# Patient Record
Sex: Female | Born: 1945 | Race: Black or African American | Hispanic: No | Smoking: Former smoker
Health system: Southern US, Community
[De-identification: ages and names within clinical notes are randomized; demographics above are authoritative.]

## PROBLEM LIST (undated history)

## (undated) DIAGNOSIS — R002 Palpitations: Secondary | ICD-10-CM

## (undated) DIAGNOSIS — K219 Gastro-esophageal reflux disease without esophagitis: Secondary | ICD-10-CM

## (undated) DIAGNOSIS — I471 Supraventricular tachycardia, unspecified: Secondary | ICD-10-CM

## (undated) DIAGNOSIS — E7800 Pure hypercholesterolemia, unspecified: Secondary | ICD-10-CM

## (undated) DIAGNOSIS — E669 Obesity, unspecified: Secondary | ICD-10-CM

## (undated) DIAGNOSIS — M549 Dorsalgia, unspecified: Secondary | ICD-10-CM

## (undated) DIAGNOSIS — M1990 Unspecified osteoarthritis, unspecified site: Secondary | ICD-10-CM

## (undated) DIAGNOSIS — I10 Essential (primary) hypertension: Secondary | ICD-10-CM

## (undated) DIAGNOSIS — E119 Type 2 diabetes mellitus without complications: Secondary | ICD-10-CM

## (undated) HISTORY — PX: TUBAL LIGATION: SHX77

## (undated) HISTORY — PX: CHOLECYSTECTOMY: SHX55

## (undated) HISTORY — DX: Unspecified osteoarthritis, unspecified site: M19.90

## (undated) HISTORY — PX: RETINAL DETACHMENT REPAIR W/ SCLERAL BUCKLE LE: SHX2338

## (undated) HISTORY — DX: Supraventricular tachycardia, unspecified: I47.10

## (undated) HISTORY — PX: APPENDECTOMY: SHX54

## (undated) HISTORY — DX: Obesity, unspecified: E66.9

## (undated) HISTORY — DX: Essential (primary) hypertension: I10

## (undated) HISTORY — DX: Type 2 diabetes mellitus without complications: E11.9

## (undated) HISTORY — PX: ABDOMINAL HYSTERECTOMY: SHX81

## (undated) HISTORY — DX: Supraventricular tachycardia: I47.1

## (undated) HISTORY — PX: VESICOVAGINAL FISTULA CLOSURE W/ TAH: SUR271

## (undated) HISTORY — DX: Dorsalgia, unspecified: M54.9

## (undated) HISTORY — DX: Gastro-esophageal reflux disease without esophagitis: K21.9

## (undated) HISTORY — PX: LEFT OOPHORECTOMY: SHX1961

---

## 2001-01-15 ENCOUNTER — Other Ambulatory Visit: Admission: RE | Admit: 2001-01-15 | Discharge: 2001-01-15 | Payer: Self-pay | Admitting: Family Medicine

## 2001-01-16 ENCOUNTER — Other Ambulatory Visit: Admission: RE | Admit: 2001-01-16 | Discharge: 2001-01-16 | Payer: Self-pay | Admitting: Family Medicine

## 2001-01-17 ENCOUNTER — Ambulatory Visit (HOSPITAL_COMMUNITY): Admission: RE | Admit: 2001-01-17 | Discharge: 2001-01-17 | Payer: Self-pay | Admitting: Family Medicine

## 2001-01-17 ENCOUNTER — Encounter: Payer: Self-pay | Admitting: Family Medicine

## 2001-12-15 ENCOUNTER — Emergency Department (HOSPITAL_COMMUNITY): Admission: EM | Admit: 2001-12-15 | Discharge: 2001-12-15 | Payer: Self-pay | Admitting: *Deleted

## 2002-02-14 ENCOUNTER — Ambulatory Visit (HOSPITAL_COMMUNITY): Admission: RE | Admit: 2002-02-14 | Discharge: 2002-02-14 | Payer: Self-pay | Admitting: Family Medicine

## 2002-04-26 ENCOUNTER — Ambulatory Visit (HOSPITAL_COMMUNITY): Admission: RE | Admit: 2002-04-26 | Discharge: 2002-04-26 | Payer: Self-pay | Admitting: Internal Medicine

## 2002-05-15 ENCOUNTER — Encounter: Admission: RE | Admit: 2002-05-15 | Discharge: 2002-08-13 | Payer: Self-pay | Admitting: Family Medicine

## 2002-09-11 ENCOUNTER — Ambulatory Visit (HOSPITAL_COMMUNITY): Admission: RE | Admit: 2002-09-11 | Discharge: 2002-09-11 | Payer: Self-pay | Admitting: Family Medicine

## 2002-09-11 ENCOUNTER — Encounter: Payer: Self-pay | Admitting: Family Medicine

## 2003-07-07 ENCOUNTER — Ambulatory Visit (HOSPITAL_COMMUNITY): Admission: RE | Admit: 2003-07-07 | Discharge: 2003-07-07 | Payer: Self-pay | Admitting: Family Medicine

## 2003-09-19 ENCOUNTER — Ambulatory Visit (HOSPITAL_COMMUNITY): Admission: RE | Admit: 2003-09-19 | Discharge: 2003-09-19 | Payer: Self-pay | Admitting: Family Medicine

## 2003-12-17 ENCOUNTER — Emergency Department (HOSPITAL_COMMUNITY): Admission: EM | Admit: 2003-12-17 | Discharge: 2003-12-17 | Payer: Self-pay | Admitting: Emergency Medicine

## 2004-01-22 ENCOUNTER — Ambulatory Visit (HOSPITAL_COMMUNITY): Admission: RE | Admit: 2004-01-22 | Discharge: 2004-01-22 | Payer: Self-pay | Admitting: Family Medicine

## 2004-04-05 ENCOUNTER — Ambulatory Visit: Payer: Self-pay | Admitting: Family Medicine

## 2004-05-06 ENCOUNTER — Ambulatory Visit: Payer: Self-pay | Admitting: Family Medicine

## 2004-05-08 ENCOUNTER — Emergency Department (HOSPITAL_COMMUNITY): Admission: EM | Admit: 2004-05-08 | Discharge: 2004-05-08 | Payer: Self-pay | Admitting: Emergency Medicine

## 2004-05-26 ENCOUNTER — Ambulatory Visit: Payer: Self-pay | Admitting: Family Medicine

## 2004-10-22 ENCOUNTER — Ambulatory Visit: Payer: Self-pay | Admitting: Family Medicine

## 2004-11-08 ENCOUNTER — Ambulatory Visit: Payer: Self-pay | Admitting: Family Medicine

## 2004-12-08 ENCOUNTER — Ambulatory Visit: Payer: Self-pay | Admitting: Family Medicine

## 2005-01-10 ENCOUNTER — Ambulatory Visit: Payer: Self-pay | Admitting: Family Medicine

## 2005-01-25 ENCOUNTER — Ambulatory Visit: Payer: Self-pay | Admitting: Family Medicine

## 2005-03-09 ENCOUNTER — Ambulatory Visit: Payer: Self-pay | Admitting: Family Medicine

## 2005-03-15 ENCOUNTER — Ambulatory Visit (HOSPITAL_COMMUNITY): Admission: RE | Admit: 2005-03-15 | Discharge: 2005-03-15 | Payer: Self-pay | Admitting: Family Medicine

## 2005-03-30 ENCOUNTER — Ambulatory Visit: Payer: Self-pay | Admitting: Family Medicine

## 2005-04-11 ENCOUNTER — Emergency Department (HOSPITAL_COMMUNITY): Admission: EM | Admit: 2005-04-11 | Discharge: 2005-04-11 | Payer: Self-pay | Admitting: Emergency Medicine

## 2005-04-12 ENCOUNTER — Ambulatory Visit: Payer: Self-pay | Admitting: Family Medicine

## 2005-05-12 ENCOUNTER — Ambulatory Visit: Payer: Self-pay | Admitting: Family Medicine

## 2005-05-26 HISTORY — PX: ENUCLEATION: SHX628

## 2005-06-23 HISTORY — PX: OTHER SURGICAL HISTORY: SHX169

## 2005-12-28 ENCOUNTER — Ambulatory Visit: Payer: Self-pay | Admitting: Family Medicine

## 2006-01-02 ENCOUNTER — Ambulatory Visit (HOSPITAL_COMMUNITY): Payer: Self-pay | Admitting: Psychiatry

## 2006-01-10 ENCOUNTER — Ambulatory Visit (HOSPITAL_COMMUNITY): Payer: Self-pay | Admitting: Psychiatry

## 2006-01-26 ENCOUNTER — Ambulatory Visit (HOSPITAL_COMMUNITY): Payer: Self-pay | Admitting: Psychiatry

## 2006-01-31 ENCOUNTER — Ambulatory Visit (HOSPITAL_COMMUNITY): Payer: Self-pay | Admitting: Psychiatry

## 2006-02-14 ENCOUNTER — Ambulatory Visit: Payer: Self-pay | Admitting: Family Medicine

## 2006-02-28 ENCOUNTER — Ambulatory Visit (HOSPITAL_COMMUNITY): Payer: Self-pay | Admitting: Psychiatry

## 2006-03-08 ENCOUNTER — Ambulatory Visit (HOSPITAL_COMMUNITY): Payer: Self-pay | Admitting: Psychiatry

## 2006-04-05 ENCOUNTER — Ambulatory Visit: Payer: Self-pay | Admitting: Family Medicine

## 2006-04-06 ENCOUNTER — Ambulatory Visit (HOSPITAL_COMMUNITY): Payer: Self-pay | Admitting: Psychiatry

## 2006-04-06 ENCOUNTER — Ambulatory Visit (HOSPITAL_COMMUNITY): Admission: RE | Admit: 2006-04-06 | Discharge: 2006-04-06 | Payer: Self-pay | Admitting: Family Medicine

## 2006-06-01 ENCOUNTER — Ambulatory Visit: Payer: Self-pay | Admitting: Family Medicine

## 2006-07-24 ENCOUNTER — Ambulatory Visit: Payer: Self-pay | Admitting: Family Medicine

## 2006-07-25 ENCOUNTER — Encounter: Payer: Self-pay | Admitting: Family Medicine

## 2006-10-24 ENCOUNTER — Ambulatory Visit (HOSPITAL_COMMUNITY): Payer: Self-pay | Admitting: Psychiatry

## 2006-10-25 ENCOUNTER — Ambulatory Visit: Payer: Self-pay | Admitting: Family Medicine

## 2006-10-25 LAB — CONVERTED CEMR LAB
ALT: 35 units/L (ref 0–35)
AST: 27 units/L (ref 0–37)
Alkaline Phosphatase: 114 units/L (ref 39–117)
Bilirubin, Direct: 0.1 mg/dL (ref 0.0–0.3)
CO2: 24 meq/L (ref 19–32)
Calcium: 9.8 mg/dL (ref 8.4–10.5)
Cholesterol: 199 mg/dL (ref 0–200)
Creatinine, Ser: 1.36 mg/dL — ABNORMAL HIGH (ref 0.40–1.20)
Glucose, Bld: 193 mg/dL — ABNORMAL HIGH (ref 70–99)
Hgb A1c MFr Bld: 8.7 % — ABNORMAL HIGH (ref 4.6–6.1)
Indirect Bilirubin: 0.3 mg/dL (ref 0.0–0.9)
Sodium: 140 meq/L (ref 135–145)
Total Bilirubin: 0.4 mg/dL (ref 0.3–1.2)
Total CHOL/HDL Ratio: 5

## 2006-10-26 ENCOUNTER — Encounter: Payer: Self-pay | Admitting: Family Medicine

## 2006-11-07 ENCOUNTER — Ambulatory Visit (HOSPITAL_COMMUNITY): Payer: Self-pay | Admitting: Psychiatry

## 2006-12-21 ENCOUNTER — Ambulatory Visit (HOSPITAL_COMMUNITY): Payer: Self-pay | Admitting: Psychiatry

## 2007-04-18 ENCOUNTER — Ambulatory Visit: Payer: Self-pay | Admitting: Family Medicine

## 2007-04-25 ENCOUNTER — Emergency Department (HOSPITAL_COMMUNITY): Admission: EM | Admit: 2007-04-25 | Discharge: 2007-04-25 | Payer: Self-pay | Admitting: Emergency Medicine

## 2007-05-01 ENCOUNTER — Ambulatory Visit: Payer: Self-pay | Admitting: Family Medicine

## 2007-05-16 ENCOUNTER — Ambulatory Visit: Payer: Self-pay | Admitting: Family Medicine

## 2007-06-13 ENCOUNTER — Ambulatory Visit: Payer: Self-pay | Admitting: Family Medicine

## 2007-06-24 HISTORY — PX: OTHER SURGICAL HISTORY: SHX169

## 2007-11-30 ENCOUNTER — Encounter: Payer: Self-pay | Admitting: Family Medicine

## 2007-11-30 DIAGNOSIS — IMO0001 Reserved for inherently not codable concepts without codable children: Secondary | ICD-10-CM | POA: Insufficient documentation

## 2007-11-30 DIAGNOSIS — R519 Headache, unspecified: Secondary | ICD-10-CM | POA: Insufficient documentation

## 2007-11-30 DIAGNOSIS — M549 Dorsalgia, unspecified: Secondary | ICD-10-CM | POA: Insufficient documentation

## 2007-11-30 DIAGNOSIS — R51 Headache: Secondary | ICD-10-CM | POA: Insufficient documentation

## 2007-11-30 DIAGNOSIS — E119 Type 2 diabetes mellitus without complications: Secondary | ICD-10-CM | POA: Insufficient documentation

## 2007-11-30 DIAGNOSIS — I10 Essential (primary) hypertension: Secondary | ICD-10-CM | POA: Insufficient documentation

## 2007-11-30 DIAGNOSIS — M1990 Unspecified osteoarthritis, unspecified site: Secondary | ICD-10-CM | POA: Insufficient documentation

## 2007-12-05 ENCOUNTER — Encounter: Payer: Self-pay | Admitting: Family Medicine

## 2007-12-13 ENCOUNTER — Encounter: Payer: Self-pay | Admitting: Family Medicine

## 2008-01-29 ENCOUNTER — Ambulatory Visit: Payer: Self-pay | Admitting: Family Medicine

## 2008-01-29 DIAGNOSIS — M810 Age-related osteoporosis without current pathological fracture: Secondary | ICD-10-CM | POA: Insufficient documentation

## 2008-01-29 DIAGNOSIS — M79609 Pain in unspecified limb: Secondary | ICD-10-CM | POA: Insufficient documentation

## 2008-01-29 LAB — CONVERTED CEMR LAB: Glucose, Bld: 186 mg/dL

## 2008-02-03 DIAGNOSIS — K219 Gastro-esophageal reflux disease without esophagitis: Secondary | ICD-10-CM | POA: Insufficient documentation

## 2008-02-14 ENCOUNTER — Encounter: Payer: Self-pay | Admitting: Family Medicine

## 2008-02-21 ENCOUNTER — Encounter: Payer: Self-pay | Admitting: Family Medicine

## 2008-03-05 ENCOUNTER — Encounter: Payer: Self-pay | Admitting: Family Medicine

## 2008-03-19 ENCOUNTER — Encounter: Payer: Self-pay | Admitting: Family Medicine

## 2008-04-21 ENCOUNTER — Encounter: Payer: Self-pay | Admitting: Family Medicine

## 2008-04-22 ENCOUNTER — Encounter: Payer: Self-pay | Admitting: Family Medicine

## 2008-04-30 ENCOUNTER — Ambulatory Visit: Payer: Self-pay | Admitting: Family Medicine

## 2008-04-30 ENCOUNTER — Encounter (INDEPENDENT_AMBULATORY_CARE_PROVIDER_SITE_OTHER): Payer: Self-pay | Admitting: *Deleted

## 2008-04-30 DIAGNOSIS — R5381 Other malaise: Secondary | ICD-10-CM | POA: Insufficient documentation

## 2008-04-30 DIAGNOSIS — R5383 Other fatigue: Secondary | ICD-10-CM | POA: Insufficient documentation

## 2008-04-30 LAB — CONVERTED CEMR LAB
ALT: 26 units/L
Albumin: 4.4 g/dL
CO2: 21 meq/L
Calcium: 9.5 mg/dL
Chloride: 103 meq/L
Creatinine, Ser: 1.19 mg/dL
LDL Cholesterol: 93 mg/dL
Potassium: 4 meq/L

## 2008-05-01 LAB — CONVERTED CEMR LAB
BUN: 18 mg/dL (ref 6–23)
Bilirubin, Direct: 0.1 mg/dL (ref 0.0–0.3)
Chloride: 103 meq/L (ref 96–112)
Creatinine, Urine: 155 mg/dL
Glucose, Bld: 172 mg/dL — ABNORMAL HIGH (ref 70–99)
Indirect Bilirubin: 0.3 mg/dL (ref 0.0–0.9)
LDL Cholesterol: 93 mg/dL (ref 0–99)
Lymphs Abs: 3.1 10*3/uL (ref 0.7–4.0)
Microalb, Ur: 1.75 mg/dL (ref 0.00–1.89)
Monocytes Relative: 7 % (ref 3–12)
Neutro Abs: 4.7 10*3/uL (ref 1.7–7.7)
Neutrophils Relative %: 54 % (ref 43–77)
Potassium: 4 meq/L (ref 3.5–5.3)
RBC: 4.39 M/uL (ref 3.87–5.11)
Total Protein: 7.2 g/dL (ref 6.0–8.3)
VLDL: 51 mg/dL — ABNORMAL HIGH (ref 0–40)
WBC: 8.7 10*3/uL (ref 4.0–10.5)

## 2008-05-14 ENCOUNTER — Ambulatory Visit (HOSPITAL_COMMUNITY): Admission: RE | Admit: 2008-05-14 | Discharge: 2008-05-14 | Payer: Self-pay | Admitting: Family Medicine

## 2008-05-19 ENCOUNTER — Telehealth: Payer: Self-pay | Admitting: Family Medicine

## 2008-05-29 ENCOUNTER — Telehealth: Payer: Self-pay | Admitting: Family Medicine

## 2008-07-11 ENCOUNTER — Encounter: Payer: Self-pay | Admitting: Family Medicine

## 2008-09-15 ENCOUNTER — Telehealth (INDEPENDENT_AMBULATORY_CARE_PROVIDER_SITE_OTHER): Payer: Self-pay | Admitting: *Deleted

## 2008-09-15 ENCOUNTER — Ambulatory Visit: Payer: Self-pay | Admitting: Family Medicine

## 2008-09-15 DIAGNOSIS — J209 Acute bronchitis, unspecified: Secondary | ICD-10-CM | POA: Insufficient documentation

## 2008-09-15 DIAGNOSIS — J0100 Acute maxillary sinusitis, unspecified: Secondary | ICD-10-CM | POA: Insufficient documentation

## 2008-09-15 DIAGNOSIS — N63 Unspecified lump in unspecified breast: Secondary | ICD-10-CM | POA: Insufficient documentation

## 2008-09-15 LAB — CONVERTED CEMR LAB: Glucose, Bld: 76 mg/dL

## 2008-11-28 ENCOUNTER — Encounter: Payer: Self-pay | Admitting: Family Medicine

## 2008-12-03 ENCOUNTER — Encounter: Payer: Self-pay | Admitting: Family Medicine

## 2008-12-17 ENCOUNTER — Ambulatory Visit: Payer: Self-pay | Admitting: Family Medicine

## 2008-12-17 LAB — CONVERTED CEMR LAB: Hgb A1c MFr Bld: 7.4 %

## 2008-12-30 ENCOUNTER — Encounter: Payer: Self-pay | Admitting: Family Medicine

## 2009-02-09 ENCOUNTER — Encounter: Payer: Self-pay | Admitting: Family Medicine

## 2009-02-11 ENCOUNTER — Encounter: Payer: Self-pay | Admitting: Family Medicine

## 2009-02-20 ENCOUNTER — Encounter: Payer: Self-pay | Admitting: Family Medicine

## 2009-03-05 ENCOUNTER — Telehealth: Payer: Self-pay | Admitting: Family Medicine

## 2009-03-06 ENCOUNTER — Ambulatory Visit: Payer: Self-pay | Admitting: Family Medicine

## 2009-03-15 ENCOUNTER — Emergency Department (HOSPITAL_COMMUNITY): Admission: EM | Admit: 2009-03-15 | Discharge: 2009-03-15 | Payer: Self-pay | Admitting: Emergency Medicine

## 2009-03-16 ENCOUNTER — Encounter: Payer: Self-pay | Admitting: Family Medicine

## 2009-03-17 ENCOUNTER — Ambulatory Visit: Payer: Self-pay | Admitting: Family Medicine

## 2009-03-17 DIAGNOSIS — M25559 Pain in unspecified hip: Secondary | ICD-10-CM | POA: Insufficient documentation

## 2009-03-17 DIAGNOSIS — I471 Supraventricular tachycardia: Secondary | ICD-10-CM | POA: Insufficient documentation

## 2009-03-17 LAB — CONVERTED CEMR LAB
Glucose, Bld: 206 mg/dL
Hgb A1c MFr Bld: 7.6 %

## 2009-04-01 ENCOUNTER — Encounter (INDEPENDENT_AMBULATORY_CARE_PROVIDER_SITE_OTHER): Payer: Self-pay | Admitting: *Deleted

## 2009-04-02 ENCOUNTER — Ambulatory Visit: Payer: Self-pay | Admitting: Cardiology

## 2009-04-13 ENCOUNTER — Telehealth: Payer: Self-pay | Admitting: Family Medicine

## 2009-04-21 ENCOUNTER — Encounter: Payer: Self-pay | Admitting: Family Medicine

## 2009-04-22 ENCOUNTER — Encounter: Payer: Self-pay | Admitting: Family Medicine

## 2009-06-26 ENCOUNTER — Encounter: Payer: Self-pay | Admitting: Family Medicine

## 2009-07-27 ENCOUNTER — Ambulatory Visit: Payer: Self-pay | Admitting: Family Medicine

## 2009-07-27 DIAGNOSIS — M5430 Sciatica, unspecified side: Secondary | ICD-10-CM | POA: Insufficient documentation

## 2009-07-27 DIAGNOSIS — M549 Dorsalgia, unspecified: Secondary | ICD-10-CM

## 2009-07-28 ENCOUNTER — Encounter: Payer: Self-pay | Admitting: Family Medicine

## 2009-08-02 ENCOUNTER — Telehealth: Payer: Self-pay | Admitting: Family Medicine

## 2009-08-04 ENCOUNTER — Encounter: Payer: Self-pay | Admitting: Family Medicine

## 2009-08-07 ENCOUNTER — Encounter: Payer: Self-pay | Admitting: Family Medicine

## 2009-08-10 ENCOUNTER — Encounter (HOSPITAL_COMMUNITY): Admission: RE | Admit: 2009-08-10 | Discharge: 2009-09-09 | Payer: Self-pay | Admitting: Family Medicine

## 2009-08-11 LAB — CONVERTED CEMR LAB
AST: 44 units/L — ABNORMAL HIGH (ref 0–37)
Albumin: 4.6 g/dL (ref 3.5–5.2)
Alkaline Phosphatase: 85 units/L (ref 39–117)
BUN: 17 mg/dL (ref 6–23)
Basophils Absolute: 0.1 10*3/uL (ref 0.0–0.1)
Basophils Relative: 1 % (ref 0–1)
Calcium: 9 mg/dL (ref 8.4–10.5)
Creatinine, Ser: 1.41 mg/dL — ABNORMAL HIGH (ref 0.40–1.20)
Eosinophils Absolute: 0.1 10*3/uL (ref 0.0–0.7)
Eosinophils Relative: 2 % (ref 0–5)
HDL: 36 mg/dL — ABNORMAL LOW (ref >39)
MCHC: 32 g/dL (ref 30.0–36.0)
MCV: 89.3 fL (ref 78.0–100.0)
Microalb, Ur: 2.11 mg/dL — ABNORMAL HIGH (ref 0.00–1.89)
Neutrophils Relative %: 44 % (ref 43–77)
Platelets: 283 10*3/uL (ref 150–400)
RDW: 13.5 % (ref 11.5–15.5)
Total Protein: 6.7 g/dL (ref 6.0–8.3)
Triglycerides: 255 mg/dL — ABNORMAL HIGH (ref <150)
WBC: 6.9 10*3/uL (ref 4.0–10.5)

## 2009-08-20 ENCOUNTER — Encounter: Payer: Self-pay | Admitting: Family Medicine

## 2009-08-21 ENCOUNTER — Encounter: Payer: Self-pay | Admitting: Family Medicine

## 2009-09-15 ENCOUNTER — Ambulatory Visit: Payer: Self-pay | Admitting: Family Medicine

## 2009-09-16 LAB — CONVERTED CEMR LAB
CO2: 24 meq/L (ref 19–32)
Calcium: 9.6 mg/dL (ref 8.4–10.5)
Chloride: 107 meq/L (ref 96–112)
Creatinine, Ser: 1.21 mg/dL — ABNORMAL HIGH (ref 0.40–1.20)
Glucose, Bld: 151 mg/dL — ABNORMAL HIGH (ref 70–99)
Sodium: 138 meq/L (ref 135–145)

## 2009-09-22 ENCOUNTER — Telehealth: Payer: Self-pay | Admitting: Family Medicine

## 2009-09-23 ENCOUNTER — Encounter: Payer: Self-pay | Admitting: Family Medicine

## 2009-10-21 ENCOUNTER — Telehealth: Payer: Self-pay | Admitting: Family Medicine

## 2009-10-27 ENCOUNTER — Ambulatory Visit: Payer: Self-pay | Admitting: Family Medicine

## 2009-10-28 ENCOUNTER — Encounter: Payer: Self-pay | Admitting: Family Medicine

## 2009-10-29 ENCOUNTER — Encounter: Payer: Self-pay | Admitting: Family Medicine

## 2010-02-02 ENCOUNTER — Ambulatory Visit: Payer: Self-pay | Admitting: Family Medicine

## 2010-02-10 ENCOUNTER — Ambulatory Visit (HOSPITAL_COMMUNITY): Admission: RE | Admit: 2010-02-10 | Discharge: 2010-02-10 | Payer: Self-pay | Admitting: Family Medicine

## 2010-02-13 ENCOUNTER — Emergency Department (HOSPITAL_COMMUNITY): Admission: EM | Admit: 2010-02-13 | Discharge: 2010-02-14 | Payer: Self-pay | Admitting: Emergency Medicine

## 2010-02-21 ENCOUNTER — Telehealth: Payer: Self-pay | Admitting: Family Medicine

## 2010-03-08 ENCOUNTER — Telehealth: Payer: Self-pay | Admitting: Family Medicine

## 2010-03-08 ENCOUNTER — Ambulatory Visit (HOSPITAL_COMMUNITY): Admission: RE | Admit: 2010-03-08 | Discharge: 2010-03-08 | Payer: Self-pay | Admitting: Family Medicine

## 2010-03-08 ENCOUNTER — Encounter: Payer: Self-pay | Admitting: Family Medicine

## 2010-03-08 DIAGNOSIS — M25579 Pain in unspecified ankle and joints of unspecified foot: Secondary | ICD-10-CM | POA: Insufficient documentation

## 2010-03-08 DIAGNOSIS — M7660 Achilles tendinitis, unspecified leg: Secondary | ICD-10-CM | POA: Insufficient documentation

## 2010-04-01 ENCOUNTER — Encounter: Payer: Self-pay | Admitting: Family Medicine

## 2010-04-01 ENCOUNTER — Ambulatory Visit: Payer: Self-pay | Admitting: Family Medicine

## 2010-04-01 DIAGNOSIS — F329 Major depressive disorder, single episode, unspecified: Secondary | ICD-10-CM | POA: Insufficient documentation

## 2010-04-01 DIAGNOSIS — F3289 Other specified depressive episodes: Secondary | ICD-10-CM | POA: Insufficient documentation

## 2010-04-02 LAB — CONVERTED CEMR LAB
Calcium: 9.5 mg/dL (ref 8.4–10.5)
Chloride: 104 meq/L (ref 96–112)
Creatinine, Ser: 1.08 mg/dL (ref 0.40–1.20)
Sodium: 141 meq/L (ref 135–145)

## 2010-04-05 ENCOUNTER — Encounter: Payer: Self-pay | Admitting: Family Medicine

## 2010-04-13 ENCOUNTER — Ambulatory Visit (HOSPITAL_COMMUNITY): Payer: Self-pay | Admitting: Psychiatry

## 2010-05-11 ENCOUNTER — Telehealth: Payer: Self-pay | Admitting: Family Medicine

## 2010-05-16 ENCOUNTER — Encounter: Payer: Self-pay | Admitting: Family Medicine

## 2010-05-17 ENCOUNTER — Ambulatory Visit (HOSPITAL_COMMUNITY): Admit: 2010-05-17 | Payer: Self-pay | Admitting: Psychiatry

## 2010-05-17 ENCOUNTER — Encounter: Payer: Self-pay | Admitting: Family Medicine

## 2010-05-25 ENCOUNTER — Telehealth: Payer: Self-pay | Admitting: Family Medicine

## 2010-05-27 ENCOUNTER — Encounter: Payer: Self-pay | Admitting: Family Medicine

## 2010-05-27 NOTE — Assessment & Plan Note (Signed)
Summary: F UP   Vital Signs:  Patient profile:   65 year old female Menstrual status:  hysterectomy Height:      67 inches Weight:      265.75 pounds BMI:     41.77 O2 Sat:      98 % on Room air Pulse rate:   66 / minute Pulse rhythm:   regular Resp:     16 per minute BP sitting:   130 / 80  (left arm)  Vitals Entered By: Adella Hare LPN (February 02, 2010 9:24 AM)  Nutrition Counseling: Patient's BMI is greater than 25 and therefore counseled on weight management options.  O2 Flow:  Room air CC: follow-up visit Is Patient Diabetic? Yes Did you bring your meter with you today? No Pain Assessment Patient in pain? no        Primary Care Provider:  Dr.Margaret Lodema Hong  CC:  follow-up visit.  History of Present Illness: Reports  that she has been doing fairly well. She denies significant back pain, she is now taking a new med for this, which is OTC , which she states helps alot. She has not been exercising, and has gained alot of weight . She has not been testing her sugars regularly, and she does not have her meter or her diary. Denies recent fever or chills. Denies sinus pressure, nasal congestion , ear pain or sore throat. Denies chest congestion, or cough productive of sputum. Denies chest pain, palpitations, PND, orthopnea or leg swelling. Denies abdominal pain, nausea, vomitting, diarrhea or constipation. Denies change in bowel movements or bloody stool. Denies dysuria , frequency, incontinence or hesitancy.  Denies headaches, vertigo, seizures. Denies depression, anxiety or insomnia. Denies  rash, lesions, or itch.     Allergies (verified): 1)  ! Biaxin  Review of Systems      See HPI General:  Complains of fatigue. 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 MS:  Complains of joint pain, low back pain, mid back pain, and stiffness. Endo:  Denies excessive thirst and excessive urination. Heme:  Denies  abnormal bruising and bleeding. Allergy:  Denies hives or rash.  Physical Exam  General:  Well-developed,obese,in no acute distress; alert,appropriate and cooperative throughout examination HEENT: No facial asymmetry,  EOMI, No sinus tenderness, TM's Clear, oropharynx  pink and moist.   Chest: Clear to auscultation bilaterally.  CVS: S1, S2, No murmurs, No S3.   Abd: Soft, Nontender.  MS: decreased  ROM spineadequate in  hips, shoulders and knees.  Ext: No edema.   CNS: CN 2-12 intact, power tone and sensation normal throughout.   Skin: Intact, no visible lesions or rashes.  Psych: Good eye contact, normal affect.  Memory intact, not anxious or depressed appearing.    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 One tab by mouth once daily    Toprol Xl 200 Mg Xr24h-tab (Metoprolol succinate) ..... One tab by mouth once daily  Orders: T-Basic Metabolic Panel [phone])  BP today: 130/80 Prior BP: 116/82 (10/27/2009)  Labs Reviewed: K+: 4.0 (09/15/2009) Creat: : 1.21 (09/15/2009)   Chol: 176 (07/28/2009)   HDL: 36 (07/28/2009)   LDL: 89 (07/28/2009)   TG: 255 (07/28/2009)  Problem # 3:  DIABETES MELLITUS, TYPE II (ICD-250.00) Assessment:  Comment Only  Her updated medication list for this problem includes:    Adult Aspirin Ec Low Strength 81 Mg Tbec (Aspirin) ..... One tab by mouth qd    Janumet 50-1000 Mg Tabs (Sitagliptin-metformin hcl) .Marland Kitchen... Take 1 tablet by mouth two times a day    Glipizide 5 Mg Xr24h-tab (Glipizide) .Marland Kitchen... Take 1 tablet by mouth every morning Patient advised to reduce carbs and sweets, commit to regular physical activity, take meds as prescribed, test blood sugars as directed, and attempt to lose weight , to  improve blood sugar control.  Orders: T- Hemoglobin A1C ([id number])  Labs Reviewed: Creat: 1.21 (09/15/2009)    Reviewed HgBA1c results: 8.1 (10/28/2009)  7.7 (07/28/2009)  Problem # 4:  OBESITY (ICD-278.00) Assessment: Deteriorated  Ht: 67 (02/02/2010)   Wt: 265.75 (02/02/2010)   BMI: 41.77 (02/02/2010) therapeutic lifestyle change discussed and encouraged  Complete Medication List: 1)  Prevacid 30 Mg Cpdr (Lansoprazole) .... One cap by mouth once daily 2)  Triamterene-hctz 75-50 Mg Tabs (Triamterene-hctz) .... One tab by mouth once daily 3)  Toprol Xl 200 Mg Xr24h-tab (Metoprolol succinate) .... One tab by mouth once daily 4)  Fish Oil 1000 Mg Caps (Omega-3 fatty acids) .... One cap by mouth qd 5)  Magnebind 400 200-1-400 Mg Tabs (Magnesium-calcium-folic acid) .... Two tabs by mouth qd 6)  Adult Aspirin Ec Low Strength 81 Mg Tbec (Aspirin) .... One tab by mouth qd 7)  Garlic 1000 Mg Caps (Garlic) .... Three caps by mouth qd 8)  Klor-con M20 20 Meq Cr-tabs (Potassium chloride crys cr) .... Take 1 tablet by mouth two times a day 9)  Potassium 99 Mg Tabs (Potassium) .... Take 1 tablet by mouth three times a day 10)  Janumet 50-1000 Mg Tabs (Sitagliptin-metformin hcl) .... Take 1 tablet by mouth two times a day 11)  Onetouch Test Strp (Glucose blood) .... Once daily testing 12)  Onetouch Delica Lancets Misc (Lancets) .... Once daily testing 13)  Glipizide 5 Mg Xr24h-tab (Glipizide) .... Take 1 tablet by mouth every morning 14)  Capsium 520mg   .... One capsule once daily 15)  Omega Xl  .... 2 tablets twice daily  Other Orders: Future Orders: Radiology Referral (Radiology) ... 02/09/2010  Patient Instructions: 1)  Please schedule a follow-up appointment in 3.5 months. 2)  BMP prior to visit, ICD-9: 3)  HbgA1C prior to visit, ICD-9:   today. 4)  you do need the flu and pneumonia vaccines. 5)  I recommend and will schedule yourmamogram. 6)  pls start regular exercise 30 mins  daily. 7)  biotin one daily help youir nails. 8)  Fasting sugars should be between 80 to 120

## 2010-05-27 NOTE — Letter (Signed)
Summary: medical release  medical release   Imported By: Lind Guest 08/07/2009 13:14:01  _____________________________________________________________________  External Attachment:    Type:   Image     Comment:   External Document

## 2010-05-27 NOTE — Assessment & Plan Note (Signed)
Summary: office visit   Vital Signs:  Patient profile:   65 year old female Menstrual status:  hysterectomy Height:      67 inches Weight:      266 pounds BMI:     41.81 O2 Sat:      99 % Pulse rate:   98 / minute Pulse rhythm:   regular Resp:     16 per minute BP sitting:   160 / 80  (left arm)  Vitals Entered By: Everitt Amber LPN (Sep 15, 2009 11:06 AM)  Nutrition Counseling: Patient's BMI is greater than 25 and therefore counseled on weight management options. CC: Follow up chronic problems   Primary Care Provider:  Dr.Jerra Huckeby Lodema Hong  CC:  Follow up chronic problems.  History of Present Illness: Improved back pain since therapy, decompressive in [place] she has had 12 treatments of 20 , and plans to return in a few months for further treatmnet. she has not been diligent in testing her blood sugars. she reports deterioration in her vision and has possible upcomuing surgery. She denies depression or anxietyy. She denies head or chest congestion. She denies dysuria , frequency or incontinence. She denies skin breakdown , rash or itch.  Current Medications (verified): 1)  Prevacid 30 Mg  Cpdr (Lansoprazole) .... One Cap By Mouth Once Daily 2)  Triamterene-Hctz 75-50 Mg  Tabs (Triamterene-Hctz) .... One Tab By Mouth Once Daily 3)  Toprol Xl 200 Mg  Xr24h-Tab (Metoprolol Succinate) .... One Tab By Mouth Once Daily 4)  Metformin Hcl 500 Mg Xr24h-Tab (Metformin Hcl) .... Two Tabs By Mouth Bid 5)  Fish Oil 1000 Mg Caps (Omega-3 Fatty Acids) .... One Cap By Mouth Qd 6)  Magnebind 400 200-1-400 Mg Tabs (Magnesium-Calcium-Folic Acid) .... Two Tabs By Mouth Qd 7)  Adult Aspirin Ec Low Strength 81 Mg Tbec (Aspirin) .... One Tab By Mouth Qd 8)  Garlic 1000 Mg Caps (Garlic) .... Three Caps By Mouth Qd 9)  Restoril 15 Mg Caps (Temazepam) .... One To Two  Capsules At Night For Sleep 10)  Aspir-Low 81 Mg Tbec (Aspirin) .... Take 1 Tab Daily 11)  Klor-Con M20 20 Meq Cr-Tabs (Potassium  Chloride Crys Cr) .... Take 1 Tablet By Mouth Two Times A Day 12)  Potassium 99 Mg Tabs (Potassium) .... Take 1 Tablet By Mouth Three Times A Day  Allergies (verified): 1)  ! 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 One tab by mouth once daily    Toprol Xl 200 Mg Xr24h-tab (Metoprolol succinate) ..... One tab by mouth once daily    Amlodipine Besylate 2.5 Mg Tabs (Amlodipine besylate) .Marland Kitchen... Take 1 tablet by mouth once a day  BP today: 160/80 Prior BP: 132/80 (07/27/2009)  Labs Reviewed: K+: 4.2 (07/28/2009) Creat: : 1.41 (07/28/2009)   Chol: 176 (07/28/2009)   HDL: 36 (07/28/2009)   LDL: 89 (07/28/2009)   TG: 255 (07/28/2009)  Problem # 4:  DIABETES MELLITUS, TYPE II (ICD-250.00) Assessment: Deteriorated  The following medications were removed from the medication list:    Metformin Hcl 500 Mg Xr24h-tab (Metformin hcl) .Marland Kitchen..Marland Kitchen Two tabs by mouth bid Her updated medication list for  this problem includes:    Adult Aspirin Ec Low Strength 81 Mg Tbec (Aspirin) ..... One tab by mouth qd    Aspir-low 81 Mg Tbec (Aspirin) .Marland Kitchen... Take 1 tab daily    Janumet 50-1000 Mg Tabs (Sitagliptin-metformin hcl) .Marland Kitchen... Take 1 tablet by mouth two times a day  Labs Reviewed: Creat: 1.41 (07/28/2009)    Reviewed HgBA1c results: 7.7 (07/28/2009)  7.6 (03/17/2009)  Complete Medication List: 1)  Prevacid 30 Mg Cpdr (Lansoprazole) .... One cap by mouth once daily 2)  Triamterene-hctz 75-50 Mg Tabs (Triamterene-hctz) .... One tab by mouth once daily 3)  Toprol Xl 200 Mg Xr24h-tab (Metoprolol succinate) .... One tab by mouth  once daily 4)  Fish Oil 1000 Mg Caps (Omega-3 fatty acids) .... One cap by mouth qd 5)  Magnebind 400 200-1-400 Mg Tabs (Magnesium-calcium-folic acid) .... Two tabs by mouth qd 6)  Adult Aspirin Ec Low Strength 81 Mg Tbec (Aspirin) .... One tab by mouth qd 7)  Garlic 1000 Mg Caps (Garlic) .... Three caps by mouth qd 8)  Restoril 15 Mg Caps (Temazepam) .... One to two  capsules at night for sleep 9)  Aspir-low 81 Mg Tbec (Aspirin) .... Take 1 tab daily 10)  Klor-con M20 20 Meq Cr-tabs (Potassium chloride crys cr) .... Take 1 tablet by mouth two times a day 11)  Potassium 99 Mg Tabs (Potassium) .... Take 1 tablet by mouth three times a day 12)  Amlodipine Besylate 2.5 Mg Tabs (Amlodipine besylate) .... Take 1 tablet by mouth once a day 13)  Janumet 50-1000 Mg Tabs (Sitagliptin-metformin hcl) .... Take 1 tablet by mouth two times a day  Other Orders: T-Basic Metabolic Panel [id number]) Radiology Referral (Radiology)  Patient Instructions: 1)  F/U in 6 weeks. 2)  BMP prior to visit, ICD-9: stat 3)  Hepatic Panel prior to visit, ICD-9: today 4)  Your vitd is low pls start OTC 1000units vit D every day. 5)  Your blood sugar is too high we will call you later about the meds 6)  No sugar pls 7)  Your blood pressure is high , you will start an additional med Prescriptions: JANUMET 50-1000 MG TABS (SITAGLIPTIN-METFORMIN HCL) Take 1 tablet by mouth two times a day  #180 x 3   Entered and Authorized by:   Syliva Overman MD   Signed by:   Syliva Overman MD on 09/15/2009   Method used:   Printed then faxed to ...       [facility]* (retail)       [address]       [place], [place]  [address]       Ph: [phone] or [phone]       Fax: [phone]   RxID:   [id number] AMLODIPINE BESYLATE 2.5 MG TABS (AMLODIPINE BESYLATE) Take 1 tablet by mouth once a day  #90 x 1   Entered and Authorized by:   Syliva Overman MD   Signed by:   Syliva Overman  MD on 09/15/2009   Method used:   Electronically to        [facility]* (retail)       [address]       [place], [place]  [address]       Ph: [phone] or [phone]       Fax: [phone]   RxID:   [id number]

## 2010-05-27 NOTE — Progress Notes (Signed)
Summary: referral for [place] of abd  Phone Note Other Incoming   Caller: dr Kawthar Ennen Summary of Call: plsrefer the pt to radioilogy for RUQ ultrasound eval elevated LFT's Initial call taken by: Syliva Overman MD,  August 02, 2009 9:02 PM  Follow-up for Phone Call        pt has appt at [facility] for 08/11/2009 8;45. pt notified  Follow-up by: Rudene Anda,  August 04, 2009 11:35 AM

## 2010-05-27 NOTE — Progress Notes (Signed)
Summary: written rx  Phone Note Call from Patient   Summary of Call: pt needs a glucose meter.  needs a written rx [phone] Initial call taken by: Rudene Anda,  October 21, 2009 9:42 AM  Follow-up for Phone Call        rx written, pls fill in the type of meter she wants Follow-up by: Syliva Overman MD,  October 21, 2009 11:48 AM  Additional Follow-up for Phone Call Additional follow up Details #1::        returned call, left message Additional Follow-up by: Adella Hare LPN,  October 21, 2009 4:45 PM    Additional Follow-up for Phone Call Additional follow up Details #2::    advised patient i will provide free meter from our office and will fax order for strips and lancets to her pharmacy Follow-up by: Adella Hare LPN,  October 22, 2009 11:53 AM  New/Updated Medications: ONETOUCH TEST  STRP (GLUCOSE BLOOD) once daily testing ONETOUCH DELICA LANCETS  MISC (LANCETS) once daily testing Prescriptions: ONETOUCH DELICA LANCETS  MISC (LANCETS) once daily testing  #100 x 0   Entered by:   Adella Hare LPN   Authorized by:   Syliva Overman MD   Signed by:   Adella Hare LPN on 45/40/9811   Method used:   Electronically to        [facility]* (retail)       [address]       [place], [place]  [address]       Ph: [phone] or [phone]       Fax: [phone]   RxID:   [id number] [facility] TEST  STRP (GLUCOSE BLOOD) once daily testing  #100 x 0   Entered by:   Adella Hare LPN   Authorized by:   Syliva Overman MD   Signed by:   Adella Hare LPN on 59/56/3875   Method used:   Electronically to        [facility]* (retail)       [address]       [place], [place]  [address]       Ph: [phone] or [phone]       Fax: [phone]   RxID:   [id number]

## 2010-05-27 NOTE — Letter (Signed)
Summary: THERAPEUTIC SHOES  THERAPEUTIC SHOES   Imported By: Lind Guest 09/23/2009 14:20:44  _____________________________________________________________________  External Attachment:    Type:   Image     Comment:   External Document

## 2010-05-27 NOTE — Progress Notes (Signed)
  Phone Note Call from Patient   Caller: Patient Summary of Call: pr called on 10/23 c/o left eye swelling. I adviused Ed eval, IO later got a rept of cellulitis. F/U was schediled in the office this past week , the pt did not show  Initial call taken by: Syliva Overman MD,  February 21, 2010 8:43 PM

## 2010-05-27 NOTE — Letter (Signed)
Summary: MEDICAL RELEASE  MEDICAL RELEASE   Imported By: Lind Guest 07/03/2009 13:42:01  _____________________________________________________________________  External Attachment:    Type:   Image     Comment:   External Document

## 2010-05-27 NOTE — Progress Notes (Signed)
Summary: refills  Phone Note Call from Patient   Summary of Call: pt would like for nurse to call in all her rx to [facility] for 90day potissum, metformin, blood pressure [phone] Initial call taken by: Rudene Anda,  May 11, 2010 11:17 AM    Prescriptions: KLOR-CON M20 20 MEQ CR-TABS (POTASSIUM CHLORIDE CRYS CR) Take 1 tablet by mouth two times a day  #180 x 0   Entered by:   Adella Hare LPN   Authorized by:   Syliva Overman MD   Signed by:   Adella Hare LPN on 60/45/4098   Method used:   Electronically to        [facility]* (retail)       [address]       [place], [place]  [address]       Ph: [phone] or [phone]       Fax: [phone]   RxID:   [id number] TRIAMTERENE-HCTZ 75-50 MG  TABS (TRIAMTERENE-HCTZ) one tab by mouth once daily  #90 x 0   Entered by:   Adella Hare LPN   Authorized by:   Syliva Overman MD   Signed by:   Adella Hare LPN on 42/59/5638   Method used:   Electronically to        [facility]* (retail)       [address]       [place], [place]  [address]       Ph: [phone] or [phone]       Fax: [phone]   RxID:   [id number] TOPROL XL 200 MG  XR24H-TAB (METOPROLOL SUCCINATE) one tab by mouth once daily  #90 x 0   Entered by:   Adella Hare LPN   Authorized by:   Syliva Overman MD   Signed by:   Adella Hare LPN on 51/76/1607   Method used:   Electronically to        [facility]* (retail)       [address]       [place], [place]  [address]       Ph: [phone] or [phone]       Fax: [phone]   RxID:   [id number]

## 2010-05-27 NOTE — Assessment & Plan Note (Signed)
Summary: ov   Vital Signs:  Patient profile:   65 year old female Menstrual status:  hysterectomy Height:      67 inches Weight:      269 pounds BMI:     42.28 O2 Sat:      95 % Pulse rate:   77 / minute Pulse rhythm:   regular Resp:     16 per minute BP sitting:   132 / 80  (left arm) Cuff size:   xl  Vitals Entered By: Everitt Amber LPN (July 27, 1608 3:24 PM)  Nutrition Counseling: Patient's BMI is greater than 25 and therefore counseled on weight management options. CC: Follow up chronic problems, hip pain, went from MRI in dec but hasn't heard anything   Primary Care Provider:  Dr.Margaret Lodema Hong  CC:  Follow up chronic problems, hip pain, and went from MRI in dec but hasn't heard anything.  History of Present Illness: Reports  thatshe had been doing well until 10 days ago, when she started developing severe hip pain which had her bent over. She has been exercising faithfully. Denies recent fever or chills. Denies sinus pressure, nasal congestion , ear pain or sore throat. Denies chest congestion, or cough productive of sputum. Denies chest pain, palpitations, PND, orthopnea or leg swelling. Denies abdominal pain, nausea, vomitting, diarrhea or constipation. Denies change in bowel movements or bloody stool. Denies dysuria , frequency, incontinence or hesitancy. Denies symptoms of uncontrolled blood sugar and has not been testing regularly. Denies headaches, vertigo, seizures. Denies depression, anxiety or insomnia. Denies  rash, lesions, or itch.     Current Medications (verified): 1)  Prevacid 30 Mg  Cpdr (Lansoprazole) .... One Cap By Mouth Once Daily 2)  Triamterene-Hctz 75-50 Mg  Tabs (Triamterene-Hctz) .... One Tab By Mouth Once Daily 3)  Toprol Xl 200 Mg  Xr24h-Tab (Metoprolol Succinate) .... One Tab By Mouth Once Daily 4)  Actos 45 Mg Tabs (Pioglitazone Hcl) .... Take 1 Tablet By Mouth Once A Day 5)  Metformin Hcl 500 Mg Xr24h-Tab (Metformin Hcl) .... Two  Tabs By Mouth Bid 6)  Tylenol 325 Mg Tabs (Acetaminophen) .... Two Tabs By Mouth Qd 7)  Fish Oil 1000 Mg Caps (Omega-3 Fatty Acids) .... One Cap By Mouth Qd 8)  Magnebind 400 200-1-400 Mg Tabs (Magnesium-Calcium-Folic Acid) .... Two Tabs By Mouth Qd 9)  Adult Aspirin Ec Low Strength 81 Mg Tbec (Aspirin) .... One Tab By Mouth Qd 10)  Garlic 1000 Mg Caps (Garlic) .... Three Caps By Mouth Qd 11)  Restoril 15 Mg Caps (Temazepam) .... One To Two  Capsules At Night For Sleep 12)  Klor-Con M20 20 Meq Cr-Tabs (Potassium Chloride Crys Cr) .... Take 1 Tablet By Mouth Two Times A Day 13)  Potassium 99 Mg Tabs (Potassium) .... 3 Tabs Daily 14)  Aspir-Low 81 Mg Tbec (Aspirin) .... Take 1 Tab Daily 15)  Celebrex 200 Mg Caps (Celecoxib) .... Take 1 Capsule By Mouth Once A Day As Needed 16)  Klor-Con M20 20 Meq Cr-Tabs (Potassium Chloride Crys Cr) .... Take 1 Tablet By Mouth Two Times A Day  Allergies (verified): 1)  ! Biaxin  Review of Systems      See HPI Eyes:  Denies blurring and discharge. MS:  bilaterasl hip pain x 1 week, was bent over has seen ortho last year was told it was her spine but she has had no f/u since. Endo:  Denies cold intolerance, excessive hunger, excessive thirst, excessive urination, heat intolerance,  polyuria, and weight change. Heme:  Denies abnormal bruising and bleeding. Allergy:  Denies hives or rash and itching eyes.  Physical Exam  General:  Well-developed,obese,in no acute distress; alert,appropriate and cooperative throughout examination HEENT: No facial asymmetry,  EOMI, No sinus tenderness, TM's Clear, oropharynx  pink and moist.   Chest: Clear to auscultation bilaterally.  CVS: S1, S2, No murmurs, No S3.   Abd: Soft, Nontender.  MS: decreased  ROM spine,tender over SI joints hips, shoulders and knees.  Ext: No edema.   CNS: CN 2-12 intact, power tone and sensation normal throughout.   Skin: Intact, no visible lesions or rashes.  Psych: Good eye contact,  normal affect.  Memory intact, not anxious or depressed appearing.    Impression & Recommendations:  Problem # 1:  BACK PAIN WITH RADICULOPATHY (ICD-729.2) Assessment Deteriorated  Orders: Physical Therapy Referral (PT)  Problem # 2:  OBESITY (ICD-278.00) Assessment: Unchanged  Ht: 67 (07/27/2009)   Wt: 269 (07/27/2009)   BMI: 42.28 (07/27/2009)  Problem # 3:  HYPERTENSION (ICD-401.9) Assessment: Improved  Her updated medication list for this problem includes:    Triamterene-hctz 75-50 Mg Tabs (Triamterene-hctz) ..... One tab by mouth once daily    Toprol Xl 200 Mg Xr24h-tab (Metoprolol succinate) ..... One tab by mouth once daily  Orders: T-Basic Metabolic Panel [id number])  BP today: 132/80 Prior BP: 143/80 (04/02/2009)  Labs Reviewed: K+: 4.0 (04/30/2008) Creat: : 1.19 (04/30/2008)   Chol: 189 (04/30/2008)   HDL: 45 (04/30/2008)   LDL: 93 (04/30/2008)   TG: 257 (04/30/2008)  Problem # 4:  DIABETES MELLITUS, TYPE II (ICD-250.00) Assessment: Comment Only  The following medications were removed from the medication list:    Actos 45 Mg Tabs (Pioglitazone hcl) .Marland Kitchen... Take 1 tablet by mouth once a day Her updated medication list for this problem includes:    Metformin Hcl 500 Mg Xr24h-tab (Metformin hcl) .Marland Kitchen..Marland Kitchen Two tabs by mouth bid    Adult Aspirin Ec Low Strength 81 Mg Tbec (Aspirin) ..... One tab by mouth qd    Aspir-low 81 Mg Tbec (Aspirin) .Marland Kitchen... Take 1 tab daily  Orders: T- Hemoglobin A1C ([id number]) T-Urine Microalbumin w/creat. ratio [id number])  Labs Reviewed: Creat: 1.19 (04/30/2008)    Reviewed HgBA1c results: 7.6 (03/17/2009)  7.4 (12/17/2008)  Complete Medication List: 1)  Prevacid 30 Mg Cpdr (Lansoprazole) .... One cap by mouth once daily 2)  Triamterene-hctz 75-50 Mg Tabs (Triamterene-hctz) .... One tab by mouth once daily 3)  Toprol Xl 200 Mg Xr24h-tab (Metoprolol succinate) .... One tab by mouth once daily 4)  Metformin Hcl 500 Mg  Xr24h-tab (Metformin hcl) .... Two tabs by mouth bid 5)  Tylenol 325 Mg Tabs (Acetaminophen) .... Two tabs by mouth qd 6)  Fish Oil 1000 Mg Caps (Omega-3 fatty acids) .... One cap by mouth qd 7)  Magnebind 400 200-1-400 Mg Tabs (Magnesium-calcium-folic acid) .... Two tabs by mouth qd 8)  Adult Aspirin Ec Low Strength 81 Mg Tbec (Aspirin) .... One tab by mouth qd 9)  Garlic 1000 Mg Caps (Garlic) .... Three caps by mouth qd 10)  Restoril 15 Mg Caps (Temazepam) .... One to two  capsules at night for sleep 11)  Klor-con M20 20 Meq Cr-tabs (Potassium chloride crys cr) .... Take 1 tablet by mouth two times a day 12)  Potassium 99 Mg Tabs (Potassium) .... 3 tabs daily 13)  Aspir-low 81 Mg Tbec (Aspirin) .... Take 1 tab daily 14)  Klor-con M20 20 Meq Cr-tabs (Potassium chloride crys cr) .Marland KitchenMarland KitchenMarland Kitchen  Take 1 tablet by mouth two times a day 15)  Ibuprofen 800 Mg Tabs (Ibuprofen) .... Take 1 tablet by mouth three times a day for 1 week, then one tablet once orn twice daily as needed 16)  Vicodin 5-500 Mg Tabs (Hydrocodone-acetaminophen) .... Take 1 tab by mouth at bedtime as needed  Other Orders: T-Hepatic Function [id number]) T-Lipid Profile [id number]) T-CBC w/Diff [id number]) T-TSH [id number]) T-Vitamin D (25-Hydroxy) [id number]) Future Orders: Radiology Referral (Radiology) ... 07/28/2009 Radiology Referral (Radiology) ... 07/28/2009  Patient Instructions: 1)  Please schedule a follow-up appointment in 3 months. 2)  It is important that you exercise regularly at least 20 minutes 5 times a week. If you develop chest pain, have severe difficulty breathing, or feel very tired , stop exercising immediately and seek medical attention. 3)  You need to lose weight. Consider a lower calorie diet and regular exercise.  4)  Check your blood sugars regularly. If your readings are usually above :250 or below 70 you should contact our office. 5)  Schedule your mammogram. 6)  you will be referred to  physical therapy for backpain Prescriptions: METFORMIN HCL 500 MG XR24H-TAB (METFORMIN HCL) two tabs by mouth bid  #120 x 4   Entered by:   Everitt Amber LPN   Authorized by:   Syliva Overman MD   Signed by:   Everitt Amber LPN on 95/63/8756   Method used:   Electronically to        [facility]* (retail)       [address]       [place], [place]  [address]       Ph: [phone] or [phone]       Fax: [phone]   RxID:   [id number] VICODIN 5-500 MG TABS (HYDROCODONE-ACETAMINOPHEN) Take 1 tab by mouth at bedtime as needed  #30 x 0   Entered and Authorized by:   Syliva Overman MD   Signed by:   Syliva Overman MD on 07/27/2009   Method used:   Printed then faxed to ...       [facility]* (retail)       [address]       [place], [place]  [address]       Ph: [phone] or [phone]       Fax: [phone]   RxID:   [id number] IBUPROFEN 800 MG TABS (IBUPROFEN) Take 1 tablet by mouth three times a day for 1 week, then one tablet once orn twice daily as needed  #60 x 1   Entered and Authorized by:   Syliva Overman MD   Signed by:   Syliva Overman MD on 07/27/2009   Method used:   Electronically to        [facility]* (retail)       [address]       [place], [place]  [address]       Ph: [phone] or [phone]       Fax: [phone]   RxID:   [id number]

## 2010-05-27 NOTE — Letter (Signed)
[facility]   Imported By: Eugenio Hoes 04/12/2010 16:36:17  _____________________________________________________________________  External Attachment:    Type:   Image     Comment:   External Document

## 2010-05-27 NOTE — Letter (Signed)
Summary: [facility]   Imported By: Rudene Anda 08/04/2009 11:36:51  _____________________________________________________________________  External Attachment:    Type:   Image     Comment:   External Document

## 2010-05-27 NOTE — Letter (Signed)
Summary: NO SHOW  NO SHOW   Imported By: Lind Guest 08/20/2009 15:16:44  _____________________________________________________________________  External Attachment:    Type:   Image     Comment:   External Document

## 2010-05-27 NOTE — Progress Notes (Signed)
Summary: pharmacy wants you to call  Phone Note Call from Patient   Summary of Call: call [phone] pharmacy on a escript for her that was just sent over left message Initial call taken by: Lind Guest,  May 11, 2010 11:08 AM  Follow-up for Phone Call        I sent in the metformin that was on her medlist which is the regular Metformin 500, 2 tabs bid   but pharmacy states she has been on the ER. The ER was last prescribed in Mar 2011. You put her on Metformin regular at her last OV.  Please confirm which she should be on.  [facility] [facility] Follow-up by: Everitt Amber LPN,  May 11, 2010 11:19 AM  Additional Follow-up for Phone Call Additional follow up Details #1::        pls let them know the ER, and send in corrected script Additional Follow-up by: Syliva Overman MD,  May 11, 2010 12:12 PM    Additional Follow-up for Phone Call Additional follow up Details #2::    pharmacy aware Follow-up by: Everitt Amber LPN,  May 11, 2010 12:50 PM  New/Updated Medications: METFORMIN HCL 500 MG XR24H-TAB (METFORMIN HCL) two tablets twice daily Prescriptions: METFORMIN HCL 500 MG XR24H-TAB (METFORMIN HCL) two tablets twice daily  #120 x 4   Entered by:   Everitt Amber LPN   Authorized by:   Syliva Overman MD   Signed by:   Everitt Amber LPN on 45/40/9811   Method used:   Electronically to        [facility]* (retail)       [address]       [place], [place]  [address]       Ph: [phone] or [phone]       Fax: [phone]   RxID:   [id number] METFORMIN HCL 500 MG XR24H-TAB (METFORMIN HCL) two tablets twice daily  #120 x 4   Entered and Authorized by:   Syliva Overman MD   Signed by:   Syliva Overman MD on 05/11/2010   Method used:   Printed then faxed to ...       [facility]* (retail)       [address]       [place], [place]  [address]       Ph: [phone] or [phone]       Fax:  [phone]   RxID:   [id number]

## 2010-05-27 NOTE — Progress Notes (Signed)
Summary: ANKLE  Phone Note Call from Patient   Summary of Call: LEFT MESSAGE HER ANKLE IS MESSED UP SWOLLEN SHE CAN NOT WALK AND WANTS TO KNOW WHO SHE CAN SEE IN PAIN  CALL BACK AT [phone] Initial call taken by: Lind Guest,  March 08, 2010 9:11 AM  Follow-up for Phone Call        patient states right ankle swollen and hurting and cannot walk on it, no recent injury, states she injured that ankle in the past but no problem with it since until now, states she has been walking the past couple days which may have aggrivated it but is so swollen she cannot walk no appt available will sent for xray per dr simpson patient aware Follow-up by: Adella Hare LPN,  March 08, 2010 9:19 AM

## 2010-05-27 NOTE — Assessment & Plan Note (Signed)
Summary: ov   Vital Signs:  Patient profile:   65 year old female Menstrual status:  hysterectomy Height:      67 inches O2 Sat:      98 % on Room air Pulse rate:   63 / minute Pulse rhythm:   regular Resp:     16 per minute BP sitting:   152 / 94  (left arm)  Vitals Entered By: Adella Hare LPN (April 01, 2010 10:55 AM)  O2 Flow:  Room air CC: follow-up visit- depressed? Is Patient Diabetic? Yes Did you bring your meter with you today? No Pain Assessment Patient in pain? no        Primary Care Provider:  Dr.Margaret Lodema Hong  CC:  follow-up visit- depressed?.  History of Present Illness: Pt states she was recently told by thegovernment hat she was going to have  all of her disability stopped, states she was told she is not disabled. She has appealed this , and was recently told that she  would get $1500 per month, she is angry, depressed and scared.  Denies recent fever or chills. Denies sinus pressure, nasal congestion , ear pain or sore throat. Denies chest congestion, or cough productive of sputum. Denies chest pain, palpitations, PND, orthopnea or leg swelling. Denies abdominal pain, nausea, vomitting, diarrhea or constipation. Denies change in bowel movements or bloody stool. Denies dysuria , frequency, incontinence or hesitancy.  Denies  vertigo, seizures.  Denies  rash, lesions, or itch.     Current Medications (verified): 1)  Prevacid 30 Mg  Cpdr (Lansoprazole) .... One Cap By Mouth Once Daily 2)  Triamterene-Hctz 75-50 Mg  Tabs (Triamterene-Hctz) .... One Tab By Mouth Once Daily 3)  Toprol Xl 200 Mg  Xr24h-Tab (Metoprolol Succinate) .... One Tab By Mouth Once Daily 4)  Fish Oil 1000 Mg Caps (Omega-3 Fatty Acids) .... One Cap By Mouth Qd 5)  Adult Aspirin Ec Low Strength 81 Mg Tbec (Aspirin) .... One Tab By Mouth Qd 6)  Garlic 1000 Mg Caps (Garlic) .... Three Caps By Mouth Qd 7)  Potassium 99 Mg Tabs (Potassium) .... Take 1 Tablet By Mouth Three Times  A Day 8)  Onetouch Test  Strp (Glucose Blood) .... Once Daily Testing 9)  Onetouch Delica Lancets  Misc (Lancets) .... Once Daily Testing 10)  Glipizide 5 Mg Xr24h-Tab (Glipizide) .... Take 1 Tablet By Mouth Every Morning 11)  Capsium 520mg  .... One Capsule Once Daily 12)  Omega Xl .... 2 Tablets Twice Daily 13)  Magnebind 400 400-200-1 Mg Tabs (Magnesium-Calcium-Folic Acid) .... Two Tabs Once Daily 14)  Vitamin D3 1000 Unit Tabs (Cholecalciferol) .... Two Tabs By Mouth Once Daily 15)  Liv-J .Marland Kitchen.. Two Caps By Mouth Once Daily 16)  Metformin Hcl 500 Mg Tabs (Metformin Hcl) .... Two Tabs By Mouth Two Times A Day 17)  Pro-Pancreas Formula .... Two Caps By Mouth Once Daily  Allergies (verified): 1)  ! Biaxin  Review of Systems      See HPI General:  Complains of fatigue and sleep disorder. Eyes:  Complains of vision loss-both eyes; denies discharge and red eye. MS:  Complains of low back pain and mid back pain. Neuro:  Complains of headaches; denies weakness; poor sleep, anxiety and depression conributing. Psych:  Complains of anxiety, depression, irritability, mental problems, and thoughts of violence; denies suicidal thoughts/plans and unusual visions or sounds; very angree with recent judgement to stop then reduce he disability, would "hurt someone"if she could, no plans ,  no direct target, wants med and therapy. Endo:  Denies cold intolerance, excessive hunger, excessive thirst, excessive urination, and heat intolerance. Heme:  Denies abnormal bruising and bleeding. Allergy:  Denies hives or rash and itching eyes.  Physical Exam  General:  Well-developedobese,in no acute distress; alert,appropriate and cooperative throughout examination HEENT: No facial asymmetry,  EOMI, No sinus tenderness, TM's Clear, oropharynx  pink and moist.   Chest: Clear to auscultation bilaterally.  CVS: S1, S2, No murmurs, No S3.   Abd: Soft, Nontender.  MS: decreased ROM spine,adequate in  hips, shoulders  and knees.  Ext: No edema.   CNS: CN 2-12 intact, power tone and sensation normal throughout.   Skin: Intact, no visible lesions or rashes.  Psych: Good eye contact, normal affect.  Memory intact, depressed appearing.tearful and angry.Anxious    Impression & Recommendations:  Problem # 1:  DEPRESSION (ICD-311) Assessment Deteriorated  Her updated medication list for this problem includes:    Effexor Xr 75 Mg Xr24h-cap (Venlafaxine hcl) .Marland Kitchen... Take 1 capsule by mouth once a day  Orders: Medicare Electronic Prescription [phone]) Psychology Referral (Psychology)  Discussed treatment options, including trial of antidpressant medication. Will refer to behavioral health. Follow-up call in in 24-48 hours and recheck in 2 weeks, sooner as needed. Patient agrees to call if any worsening of symptoms or thoughts of doing harm arise. Verified that the patient has no suicidal ideation at this time.   Problem # 2:  DIABETES MELLITUS, TYPE II (ICD-250.00) Assessment: Deteriorated  The following medications were removed from the medication list:    Janumet 50-1000 Mg Tabs (Sitagliptin-metformin hcl) .Marland Kitchen... Take 1 tablet by mouth two times a day Her updated medication list for this problem includes:    Adult Aspirin Ec Low Strength 81 Mg Tbec (Aspirin) ..... One tab by mouth qd    Glipizide 5 Mg Xr24h-tab (Glipizide) .Marland Kitchen... Take 1 tablet by mouth every morning    Metformin Hcl 500 Mg Tabs (Metformin hcl) .Marland Kitchen..Marland Kitchen Two tabs by mouth two times a day pt non compliant with both medicaton and diet, stressed and depressed Labs Reviewed: Creat: 1.21 (09/15/2009)    Reviewed HgBA1c results: 8.1 (10/28/2009)  7.7 (07/28/2009)  Problem # 3:  HYPERTENSION (ICD-401.9) Assessment: Deteriorated  BP today: 152/94 Prior BP: 130/80 (02/02/2010) Patient advised to follow low sodium diet rich in fruit and vegetables, and to commit to at least 30 minutes 5 days per week of regular exercise , to improve blood presure  control.   Labs Reviewed: K+: 4.0 (09/15/2009) Creat: : 1.21 (09/15/2009)   Chol: 176 (07/28/2009)   HDL: 36 (07/28/2009)   LDL: 89 (07/28/2009)   TG: 255 (07/28/2009)  Complete Medication List: 1)  Prevacid 30 Mg Cpdr (Lansoprazole) .... One cap by mouth once daily 2)  Triamterene-hctz 75-50 Mg Tabs (Triamterene-hctz) .... One tab by mouth once daily 3)  Toprol Xl 200 Mg Xr24h-tab (Metoprolol succinate) .... One tab by mouth once daily 4)  Fish Oil 1000 Mg Caps (Omega-3 fatty acids) .... One cap by mouth qd 5)  Adult Aspirin Ec Low Strength 81 Mg Tbec (Aspirin) .... One tab by mouth qd 6)  Garlic 1000 Mg Caps (Garlic) .... Three caps by mouth qd 7)  Potassium 99 Mg Tabs (Potassium) .... Take 1 tablet by mouth three times a day 8)  Onetouch Test Strp (Glucose blood) .... Once daily testing 9)  Onetouch Delica Lancets Misc (Lancets) .... Once daily testing 10)  Glipizide 5 Mg Xr24h-tab (Glipizide) .... Take 1  tablet by mouth every morning 11)  Capsium 520mg   .... One capsule once daily 12)  Omega Xl  .... 2 tablets twice daily 13)  Magnebind 400 400-200-1 Mg Tabs (Magnesium-calcium-folic acid) .... Two tabs once daily 14)  Vitamin D3 1000 Unit Tabs (Cholecalciferol) .... Two tabs by mouth once daily 15)  Liv-j  .... Two caps by mouth once daily 16)  Metformin Hcl 500 Mg Tabs (Metformin hcl) .... Two tabs by mouth two times a day 17)  Pro-pancreas Formula  .... Two caps by mouth once daily 18)  Effexor Xr 75 Mg Xr24h-cap (Venlafaxine hcl) .... Take 1 capsule by mouth once a day 19)  Klor-con M20 20 Meq Cr-tabs (Potassium chloride crys cr) .... Take 1 tablet by mouth two times a day  Patient Instructions: 1)  Please schedule a follow-up appointment in 2 months. 2)  It is important that you exercise regularly at least 30 minutes 5 times a week. If you develop chest pain, have severe difficulty breathing, or feel very tired , stop exercising immediately and seek medical attention. 3)  You  need to lose weight. Consider a lower calorie diet and regular exercise. 4)  New med as discussed and referral to therapy  5)  labstoday Prescriptions: KLOR-CON M20 20 MEQ CR-TABS (POTASSIUM CHLORIDE CRYS CR) Take 1 tablet by mouth two times a day  #60 x 3   Entered and Authorized by:   Syliva Overman MD   Signed by:   Syliva Overman MD on 04/01/2010   Method used:   Electronically to        [facility]* (retail)       [address]       [place], [place]  [address]       Ph: [phone] or [phone]       Fax: [phone]   RxID:   [id number] EFFEXOR XR 75 MG XR24H-CAP (VENLAFAXINE HCL) Take 1 capsule by mouth once a day  #30 x 2   Entered and Authorized by:   Syliva Overman MD   Signed by:   Syliva Overman MD on 04/01/2010   Method used:   Electronically to        [facility]* (retail)       [address]       [place], [place]  [address]       Ph: [phone] or [phone]       Fax: [phone]   RxID:   [id number]    Orders Added: 1)  Est. Patient Level IV [[address]] 2)  Medicare Electronic Prescription [G2542] 3)  Psychology Referral [Psychology]

## 2010-05-27 NOTE — Progress Notes (Signed)
Summary: dr appt  Phone Note Call from Patient   Summary of Call: going to her daughters  and will see the dr there at [facility] Initial call taken by: Lind Guest,  March 08, 2010 4:38 PM  Follow-up for Phone Call        thanks Follow-up by: Syliva Overman MD,  March 08, 2010 5:35 PM

## 2010-05-27 NOTE — Assessment & Plan Note (Signed)
Summary: office visit   Vital Signs:  Patient profile:   65 year old female Menstrual status:  hysterectomy Height:      67 inches Weight:      258.50 pounds BMI:     40.63 O2 Sat:      98 % on Room air Pulse rate:   112 / minute Pulse rhythm:   regular Resp:     16 per minute BP sitting:   116 / 82  (left arm)  Vitals Entered By: Adella Hare LPN (October 27, 452 1:32 PM)  Nutrition Counseling: Patient's BMI is greater than 25 and therefore counseled on weight management options.  O2 Flow:  Room air CC: follow-up visit Is Patient Diabetic? Yes Did you bring your meter with you today? No Pain Assessment Patient in pain? no        Primary Care Provider:  Dr.Shaunte Tuft Lodema Hong  CC:  follow-up visit.  History of Present Illness: Reports  that she has been doihas been walking regularly and ism,odifying her diet, she has lost weight . Unfortunately, she  has not been testing her sugars regularly, she denies polyuria, polydypsia , blurred vision or hypoglycemic episodes. She staes she feels that janumet "does not agree with her" , causing forgetfullness. I told her this was very unlikely, hersleep is disturbed. She states she will wait until the next lab results are available Denies recent fever or chills. Denies sinus pressure, nasal congestion , ear pain or sore throat. Denies chest congestion, or cough productive of sputum. Denies chest pain, palpitations, PND, orthopnea or leg swelling. Denies abdominal pain, nausea, vomitting, diarrhea or constipation. Denies change in bowel movements or bloody stool. Denies dysuria , frequency, incontinence or hesitancy. Denies  joint pain, swelling, or reduced mobility. Denies headaches, vertigo, seizures. Denies depression, anxiety or insomnia. Denies  rash, lesions, or itch.     Current Medications (verified): 1)  Prevacid 30 Mg  Cpdr (Lansoprazole) .... One Cap By Mouth Once Daily 2)  Triamterene-Hctz 75-50 Mg  Tabs  (Triamterene-Hctz) .... One Tab By Mouth Once Daily 3)  Toprol Xl 200 Mg  Xr24h-Tab (Metoprolol Succinate) .... One Tab By Mouth Once Daily 4)  Fish Oil 1000 Mg Caps (Omega-3 Fatty Acids) .... One Cap By Mouth Qd 5)  Magnebind 400 200-1-400 Mg Tabs (Magnesium-Calcium-Folic Acid) .... Two Tabs By Mouth Qd 6)  Adult Aspirin Ec Low Strength 81 Mg Tbec (Aspirin) .... One Tab By Mouth Qd 7)  Garlic 1000 Mg Caps (Garlic) .... Three Caps By Mouth Qd 8)  Klor-Con M20 20 Meq Cr-Tabs (Potassium Chloride Crys Cr) .... Take 1 Tablet By Mouth Two Times A Day 9)  Potassium 99 Mg Tabs (Potassium) .... Take 1 Tablet By Mouth Three Times A Day 10)  Janumet 50-1000 Mg Tabs (Sitagliptin-Metformin Hcl) .... Take 1 Tablet By Mouth Two Times A Day 11)  Onetouch Test  Strp (Glucose Blood) .... Once Daily Testing 12)  Onetouch Delica Lancets  Misc (Lancets) .... Once Daily Testing  Allergies (verified): 1)  ! Biaxin  Review of Systems      See HPI Eyes:  Denies discharge, eye pain, and red eye. Endo:  Denies cold intolerance, excessive hunger, excessive thirst, excessive urination, heat intolerance, polyuria, and weight change. Heme:  Denies abnormal bruising and bleeding. Allergy:  Denies hives or rash and itching eyes.  Physical Exam  General:  Well-developed,obese,in no acute distress; alert,appropriate and cooperative throughout examination HEENT: No facial asymmetry,  EOMI, No sinus tenderness, TM's Clear,  oropharynx  pink and moist.   Chest: Clear to auscultation bilaterally.  CVS: S1, S2, No murmurs, No S3.   Abd: Soft, Nontender.  MS: decreased  ROM spine,tender over SI joints hips, shoulders and knees.  Ext: No edema.   CNS: CN 2-12 intact, power tone and sensation normal throughout.   Skin: Intact, no visible lesions or rashes.  Psych: Good eye contact, normal affect.  Memory intact, not anxious or depressed appearing.   Diabetes Management Exam:    Foot Exam (with socks and/or shoes not  present):       Sensory-Monofilament:          Left foot: diminished          Right foot: diminished       Inspection:          Left foot: normal          Right foot: normal       Nails:          Left foot: normal          Right foot: normal   Impression & Recommendations:  Problem # 1:  HYPERTENSION (ICD-401.9) Assessment Improved  The following medications were removed from the medication list:    Amlodipine Besylate 2.5 Mg Tabs (Amlodipine besylate) .Marland Kitchen... Take 1 tablet by mouth once a day Her updated medication list for this problem includes:    Triamterene-hctz 75-50 Mg Tabs (Triamterene-hctz) ..... One tab by mouth once daily    Toprol Xl 200 Mg Xr24h-tab (Metoprolol succinate) ..... One tab by mouth once daily  Orders: T-Basic Metabolic Panel [id number])  BP today: 116/82 Prior BP: 160/80 (09/15/2009)  Labs Reviewed: K+: 4.0 (09/15/2009) Creat: : 1.21 (09/15/2009)   Chol: 176 (07/28/2009)   HDL: 36 (07/28/2009)   LDL: 89 (07/28/2009)   TG: 255 (07/28/2009)  Problem # 2:  OBESITY (ICD-278.00) Assessment: Improved  Orders: T-Lipid Profile ([id number])  Ht: 67 (10/27/2009)   Wt: 258.50 (10/27/2009)   BMI: 40.63 (10/27/2009)  Problem # 3:  DIABETES MELLITUS, TYPE II (ICD-250.00) Assessment: Comment Only  The following medications were removed from the medication list:    Aspir-low 81 Mg Tbec (Aspirin) .Marland Kitchen... Take 1 tab daily Her updated medication list for this problem includes:    Adult Aspirin Ec Low Strength 81 Mg Tbec (Aspirin) ..... One tab by mouth qd    Janumet 50-1000 Mg Tabs (Sitagliptin-metformin hcl) .Marland Kitchen... Take 1 tablet by mouth two times a day  Orders: T- Hemoglobin A1C ([id number]) T- Hemoglobin A1C ([id number])  Labs Reviewed: Creat: 1.21 (09/15/2009)    Reviewed HgBA1c results: 7.7 (07/28/2009)  7.6 (03/17/2009)  Problem # 4:  HIP PAIN, BILATERAL (ICD-719.45) Assessment: Improved  The following medications were removed from the  medication list:    Aspir-low 81 Mg Tbec (Aspirin) .Marland Kitchen... Take 1 tab daily Her updated medication list for this problem includes:    Adult Aspirin Ec Low Strength 81 Mg Tbec (Aspirin) ..... One tab by mouth qd  Complete Medication List: 1)  Prevacid 30 Mg Cpdr (Lansoprazole) .... One cap by mouth once daily 2)  Triamterene-hctz 75-50 Mg Tabs (Triamterene-hctz) .... One tab by mouth once daily 3)  Toprol Xl 200 Mg Xr24h-tab (Metoprolol succinate) .... One tab by mouth once daily 4)  Fish Oil 1000 Mg Caps (Omega-3 fatty acids) .... One cap by mouth qd 5)  Magnebind 400 200-1-400 Mg Tabs (Magnesium-calcium-folic acid) .... Two tabs by mouth qd 6)  Adult Aspirin Ec Low Strength 81 Mg  Tbec (Aspirin) .... One tab by mouth qd 7)  Garlic 1000 Mg Caps (Garlic) .... Three caps by mouth qd 8)  Klor-con M20 20 Meq Cr-tabs (Potassium chloride crys cr) .... Take 1 tablet by mouth two times a day 9)  Potassium 99 Mg Tabs (Potassium) .... Take 1 tablet by mouth three times a day 10)  Janumet 50-1000 Mg Tabs (Sitagliptin-metformin hcl) .... Take 1 tablet by mouth two times a day 11)  Onetouch Test Strp (Glucose blood) .... Once daily testing 12)  Onetouch Delica Lancets Misc (Lancets) .... Once daily testing  Patient Instructions: 1)  Please schedule a follow-up appointment in 3 months. 2)  It is important that you exercise regularly at least 20 minutes 5 times a week. If you develop chest pain, have severe difficulty breathing, or feel very tired , stop exercising immediately and seek medical attention. 3)  You need to lose weight. Consider a lower calorie diet and regular exercise. congrats on weight loss, and you rregular exercise  4)  HbgA1C prior to visit, ICD-9:  today 5)  BMP prior to visit, ICD-9: 6)  Your blood pressure is great , no med change

## 2010-05-27 NOTE — Miscellaneous (Signed)
Summary: Rehab Report  Rehab Report   Imported By: Lind Guest 08/21/2009 11:03:31  _____________________________________________________________________  External Attachment:    Type:   Image     Comment:   External Document

## 2010-05-27 NOTE — Letter (Signed)
Summary: [place]  [place]d [place]   Imported By: Rudene Anda 08/04/2009 16:43:10  _____________________________________________________________________  External Attachment:    Type:   Image     Comment:   External Document

## 2010-05-27 NOTE — Miscellaneous (Signed)
  Clinical Lists Changes  Medications: Added new medication of GLIPIZIDE 5 MG XR24H-TAB (GLIPIZIDE) Take 1 tablet by mouth every morning - Signed Rx of GLIPIZIDE 5 MG XR24H-TAB (GLIPIZIDE) Take 1 tablet by mouth every morning;  #30 x 3;  Signed;  Entered by: Syliva Overman MD;  Authorized by: Syliva Overman MD;  Method used: Electronically to [facility]*, [address], [place], [place], [place]  [address], Ph: [phone] or [phone], Fax: [phone]    Prescriptions: GLIPIZIDE 5 MG XR24H-TAB (GLIPIZIDE) Take 1 tablet by mouth every morning  #30 x 3   Entered and Authorized by:   Syliva Overman MD   Signed by:   Syliva Overman MD on 10/29/2009   Method used:   Electronically to        [facility]* (retail)       [address]       [place], [place]  [address]       Ph: [phone] or [phone]       Fax: [phone]   RxID:   [id number]

## 2010-05-27 NOTE — Progress Notes (Signed)
  Phone Note Call from Patient   Summary of Call: Patient having GI upset with her Janumet. Doesn't want to take it anymore. I told her you were out of the office this week and if she felt like she needed to she could only take one with her largest meal of the day until you got back and decided what to do. Initial call taken by: Everitt Amber LPN,  Sep 22, 2009 10:54 AM  Follow-up for Phone Call        Dr. Lodema Hong agrees with advise and will call patient Monday to see how she is doing and to get her to increase to one tab twice daily to see how she tolerates it. Follow-up by: Everitt Amber LPN,  September 24, 979 9:28 AM

## 2010-05-27 NOTE — Miscellaneous (Signed)
  Clinical Lists Changes  Problems: Added new problem of ANKLE PAIN, RIGHT (ICD-719.47) Added new problem of ACHILLES BURSITIS OR TENDINITIS (ICD-726.71) Orders: Added new Referral order of Orthopedic Referral (Ortho) - Signed

## 2010-05-27 NOTE — Letter (Signed)
Summary: MED LIST REVIEW  MED LIST REVIEW   Imported By: Lind Guest 02/02/2010 10:54:56  _____________________________________________________________________  External Attachment:    Type:   Image     Comment:   External Document

## 2010-05-28 ENCOUNTER — Telehealth (INDEPENDENT_AMBULATORY_CARE_PROVIDER_SITE_OTHER): Payer: Self-pay | Admitting: *Deleted

## 2010-06-02 NOTE — Progress Notes (Signed)
Summary: pain in bottom  Phone Note Call from Patient   Summary of Call: pt would like to talk with nurse about having pain in bottom. [phone] Initial call taken by: Rudene Anda,  May 25, 2010 3:39 PM  Follow-up for Phone Call        terrible pain in hips and back  does she need a ortho referal states she wants somthing to do Follow-up by: Adella Hare LPN,  May 25, 2010 5:12 PM  Additional Follow-up for Phone Call Additional follow up Details #1::        yes we can start with ortho eval, I will put in order Additional Follow-up by: Syliva Overman MD,  May 26, 2010 8:13 AM  New Problems: HIP PAIN, BILATERAL (ICD-719.45)   Additional Follow-up for Phone Call Additional follow up Details #2::    called patient, left message to let her know about ortho referral Follow-up by: Everitt Amber LPN,  May 26, 2010 2:11 PM  New Problems: HIP PAIN, BILATERAL (ICD-719.45) called patient to ensure she is aware ortho referal has been made left message Adella Hare LPN  May 27, 2010 1:01 PM  Appended Document: pain in bottom patient called back and is aware of ortho referal

## 2010-06-02 NOTE — Letter (Signed)
Summary: diabeic shoes  diabeic shoes   Imported By: Lind Guest 05/27/2010 09:29:35  _____________________________________________________________________  External Attachment:    Type:   Image     Comment:   External Document

## 2010-06-02 NOTE — Progress Notes (Signed)
Summary: refer to bethea  Phone Note Call from Patient   Summary of Call: would like to get appt with dr. Eduard Clos. is this okay [phone] Initial call taken by: Rudene Anda,  May 28, 2010 10:20 AM  Follow-up for Phone Call        pls advise and refer to dr Eduard Clos eval and management of back pain Follow-up by: Syliva Overman MD,  May 28, 2010 11:43 AM  Additional Follow-up for Phone Call Additional follow up Details #1::        pts information was sent to dr. Eduard Clos office for appt. they will call pt with appt and time. pt was notified and left a message.  Additional Follow-up by: Rudene Anda,  May 28, 2010 2:41 PM

## 2010-06-07 ENCOUNTER — Encounter: Payer: Self-pay | Admitting: Family Medicine

## 2010-06-07 ENCOUNTER — Ambulatory Visit (INDEPENDENT_AMBULATORY_CARE_PROVIDER_SITE_OTHER): Payer: Medicare Other | Admitting: Family Medicine

## 2010-06-07 DIAGNOSIS — I10 Essential (primary) hypertension: Secondary | ICD-10-CM

## 2010-06-07 DIAGNOSIS — E119 Type 2 diabetes mellitus without complications: Secondary | ICD-10-CM

## 2010-06-07 DIAGNOSIS — E669 Obesity, unspecified: Secondary | ICD-10-CM

## 2010-06-16 NOTE — Assessment & Plan Note (Signed)
Summary: follow up   Vital Signs:  Patient profile:   65 year old female Menstrual status:  hysterectomy Height:      67 inches Weight:      268.50 pounds BMI:     42.21 O2 Sat:      96 % Pulse rate:   65 / minute Pulse rhythm:   regular Resp:     16 per minute BP sitting:   160 / 92  (left arm) Cuff size:   xl  Vitals Entered By: Everitt Amber LPN (June 07, 2010 11:07 AM)  Nutrition Counseling: Patient's BMI is greater than 25 and therefore counseled on weight management options. CC: Follow up chronic problems, both hips hurting her today, has been to dr. Eduard Clos today   Primary Care Provider:  Dr.Jeselle Hiser Lodema Hong  CC:  Follow up chronic problems, both hips hurting her today, and has been to dr. Eduard Clos today.  History of Present Illness: Pain in low back and hips for past 6 weeks ever since she walked,she saw th pain specialist earlier today and is anticipating getting a significant amt of helpfrom her.  Denies recent fever or chills. Denies sinus pressure, nasal congestion , ear pain or sore throat. Denies chest congestion, or cough productive of sputum. Denies chest pain, palpitations, PND, orthopnea or leg swelling. Denies abdominal pain, nausea, vomitting, diarrhea or constipation. Denies change in bowel movements or bloody stool. Denies dysuria , frequency, incontinence or hesitancy.  Denies headaches, vertigo, seizures. Reports marked improvement in her depression, she is also in therapy. Denies  rash, lesions, or itch.     Current Medications (verified): 1)  Prevacid 30 Mg  Cpdr (Lansoprazole) .... One Cap By Mouth Once Daily 2)  Triamterene-Hctz 75-50 Mg  Tabs (Triamterene-Hctz) .... One Tab By Mouth Once Daily 3)  Toprol Xl 200 Mg  Xr24h-Tab (Metoprolol Succinate) .... One Tab By Mouth Once Daily 4)  Fish Oil 1000 Mg Caps (Omega-3 Fatty Acids) .... One Cap By Mouth Qd 5)  Adult Aspirin Ec Low Strength 81 Mg Tbec (Aspirin) .... One Tab By Mouth Qd 6)   Garlic 1000 Mg Caps (Garlic) .... Three Caps By Mouth Qd 7)  Potassium 99 Mg Tabs (Potassium) .... Take 1 Tablet By Mouth Three Times A Day 8)  Onetouch Test  Strp (Glucose Blood) .... Once Daily Testing 9)  Onetouch Delica Lancets  Misc (Lancets) .... Once Daily Testing 10)  Glipizide 5 Mg Xr24h-Tab (Glipizide) .... Take 1 Tablet By Mouth Every Morning 11)  Capsium 520mg  .... One Capsule Once Daily 12)  Omega Xl .... 2 Tablets Twice Daily 13)  Magnebind 400 400-200-1 Mg Tabs (Magnesium-Calcium-Folic Acid) .... Two Tabs Once Daily 14)  Vitamin D3 1000 Unit Tabs (Cholecalciferol) .... Two Tabs By Mouth Once Daily 15)  Liv-J .Marland Kitchen.. Two Caps By Mouth Once Daily 16)  Pro-Pancreas Formula .... Two Caps By Mouth Once Daily 17)  Klor-Con M20 20 Meq Cr-Tabs (Potassium Chloride Crys Cr) .... Take 1 Tablet By Mouth Two Times A Day 18)  Metformin Hcl 500 Mg Xr24h-Tab (Metformin Hcl) .... Two Tablets Twice Daily  Allergies (verified): 1)  ! Biaxin  Review of Systems      See HPI Eyes:  Complains of vision loss-1 eye. MS:  Complains of joint pain, low back pain, mid back pain, and stiffness. Endo:  Denies excessive thirst and excessive urination; tests once every 1 to3 days,blood sugars are higher than they shoould be. Heme:  Denies abnormal bruising and bleeding. Allergy:  Denies hives or rash and itching eyes.  Physical Exam  General:  Well-developedobese,in no acute distress; alert,appropriate and cooperative throughout examination HEENT: No facial asymmetry,  EOMI, No sinus tenderness, TM's Clear, oropharynx  pink and moist.   Chest: Clear to auscultation bilaterally.  CVS: S1, S2, No murmurs, No S3.   Abd: Soft, Nontender.  MS: decreased ROM spine,and  in  hips,adequate in  shoulders and knees.  Ext: No edema.   CNS: CN 2-12 intact, power tone and sensation normal throughout.   Skin: Intact, no visible lesions or rashes.  Psych: Good eye contact, normal affect.  Memory intact, neither  depressed nor anxious appearing.    Impression & Recommendations:  Problem # 1:  HIP PAIN, BILATERAL (ICD-719.45) Assessment Deteriorated  Her updated medication list for this problem includes:    Adult Aspirin Ec Low Strength 81 Mg Tbec (Aspirin) ..... One tab by mouth qdhas established with pain management  Problem # 2:  DEPRESSION (ICD-311) Assessment: Improved  The following medications were removed from the medication list:    Effexor Xr 75 Mg Xr24h-cap (Venlafaxine hcl) .Marland Kitchen... Take 1 capsule by mouth once a day  Problem # 3:  BACK PAIN WITH RADICULOPATHY (ICD-729.2) Assessment: Deteriorated  Problem # 4:  HYPERTENSION (ICD-401.9) Assessment: Unchanged  Her updated medication list for this problem includes:    Triamterene-hctz 75-50 Mg Tabs (Triamterene-hctz) ..... One tab by mouth once daily    Toprol Xl 200 Mg Xr24h-tab (Metoprolol succinate) ..... One tab by mouth once daily has not had meds reportedly and in pain, i believe she needs additional med but she is resistant Orders: T-Basic Metabolic Panel [id number]) T-Basic Metabolic Panel [phone]) Medicare Electronic Prescription [phone])  BP today: 160/92 Prior BP: 152/94 (04/01/2010)  Labs Reviewed: K+: 4.3 (04/01/2010) Creat: : 1.08 (04/01/2010)   Chol: 176 (07/28/2009)   HDL: 36 (07/28/2009)   LDL: 89 (07/28/2009)   TG: 255 (07/28/2009)  Problem # 5:  DIABETES MELLITUS, TYPE II (ICD-250.00) Assessment: Comment Only  Labs Reviewed: Creat: 1.08 (04/01/2010)    Reviewed HgBA1c results: 7.6 (04/01/2010)  8.1 (10/28/2009) Patient advised to reduce carbs and sweets, commit to regular physical activity, take meds as prescribed, test blood sugars as directed, and attempt to lose weight , to improve blood sugar control.  Orders: T- Hemoglobin A1C ([id number])  Complete Medication List: 1)  Prevacid 30 Mg Cpdr (Lansoprazole) .... One cap by mouth once daily 2)  Triamterene-hctz 75-50 Mg Tabs  (Triamterene-hctz) .... One tab by mouth once daily 3)  Toprol Xl 200 Mg Xr24h-tab (Metoprolol succinate) .... One tab by mouth once daily 4)  Fish Oil 1000 Mg Caps (Omega-3 fatty acids) .... One cap by mouth qd 5)  Adult Aspirin Ec Low Strength 81 Mg Tbec (Aspirin) .... One tab by mouth qd 6)  Garlic 1000 Mg Caps (Garlic) .... Three caps by mouth qd 7)  Potassium 99 Mg Tabs (Potassium) .... Take 1 tablet by mouth three times a day 8)  Onetouch Test Strp (Glucose blood) .... Once daily testing 9)  Onetouch Delica Lancets Misc (Lancets) .... Once daily testing 10)  Glipizide 5 Mg Xr24h-tab (Glipizide) .... Take 1 tablet by mouth every morning 11)  Capsium 520mg   .... One capsule once daily 12)  Omega Xl  .... 2 tablets twice daily 13)  Magnebind 400 400-200-1 Mg Tabs (Magnesium-calcium-folic acid) .... Two tabs once daily 14)  Vitamin D3 1000 Unit Tabs (Cholecalciferol) .... Two tabs by mouth once daily 15)  Liv-j  .Marland KitchenMarland KitchenMarland Kitchen  Two caps by mouth once daily 16)  Pro-pancreas Formula  .... Two caps by mouth once daily 17)  Klor-con M20 20 Meq Cr-tabs (Potassium chloride crys cr) .... Take 1 tablet by mouth two times a day 18)  Metformin Hcl 500 Mg Xr24h-tab (Metformin hcl) .... Two tablets twice daily 19)  Glipizide 2.5 Mg Xr24h-tab (Glipizide) .... One tablet in the evening , effective 06/07/2010 , continue the 5mg  tablet every morning  Other Orders: T-Lipid Profile [id number]) T-Hepatic Function [phone]) T-CBC w/Diff ([id number]) T-TSH [phone])  Patient Instructions: 1)  Please schedule a follow-up appointment in 4 months. 2)  It is important that you exercise regularly at least 20 minutes 5 times a week. If you develop chest pain, have severe difficulty breathing, or feel very tired , stop exercising immediately and seek medical attention.pls join the [employer] and start with water exercises. 3)  You need to lose weight. Consider a lower calorie diet and regular exercise.  4)  HbgA1C  prior to visit, ICD-9: and chem 7 July 01, 2010. 5)  Pls take the triamterene every day, this is for your blood pressure as well as the toprol, that is why you pressure is up, 6)  start testing sugars every morning, amd pls start taking the 2.5 mg glipizide in the evenings. 7)  pls call the office if your blood sugars are too high espescially with the steroid injections you will be starting 8)  BMP prior to visit, ICD-9: 9)  Hepatic Panel prior to visit, ICD-9: 10)  Lipid Panel prior to visit, ICD-9: 11)  TSH prior to visit, ICD-9:  fasting in 4 months 12)  CBC w/ Diff prior to visit, ICD-9: 13)  HbgA1C prior to visit, ICD-9: g Prescriptions: TOPROL XL 200 MG  XR24H-TAB (METOPROLOL SUCCINATE) one tab by mouth once daily  #90 x 0   Entered by:   Adella Hare LPN   Authorized by:   Syliva Overman MD   Signed by:   Adella Hare LPN on 28/41/3244   Method used:   Electronically to        [facility]* (retail)       [address]       [place], [place]  [address]       Ph: [phone] or [phone]       Fax: [phone]   RxID:   [id number] GLIPIZIDE 2.5 MG XR24H-TAB (GLIPIZIDE) one tablet in the evening , effective 06/07/2010 , continue the 5mg  tablet every morning  #30 x 3   Entered and Authorized by:   Syliva Overman MD   Signed by:   Syliva Overman MD on 06/07/2010   Method used:   Printed then faxed to ...       [facility]* (retail)       [address]       [place], [place]  [address]       Ph: [phone] or [phone]       Fax: [phone]   RxID:   [id number]    Orders Added: 1)  Est. Patient Level IV [99214] 2)  T-Basic Metabolic Panel [phone] 3)  T- Hemoglobin A1C [[id number]] 4)  T-Basic Metabolic Panel [[id number]] 5)  T-Lipid Profile [[id number]] 6)  T-Hepatic Function [[id number]] 7)  T-CBC w/Diff [[id number]] 8)  T- Hemoglobin A1C [83036-23375] 9)  T-TSH  [[id number]] 10)  Medicare Electronic Prescription [[id number]]

## 2010-06-18 ENCOUNTER — Encounter: Payer: Self-pay | Admitting: Family Medicine

## 2010-06-22 NOTE — Letter (Signed)
Summary: [facility]   Imported By: Lind Guest 06/18/2010 11:22:16  _____________________________________________________________________  External Attachment:    Type:   Image     Comment:   External Document

## 2010-07-07 LAB — CBC
Hemoglobin: 12.3 g/dL (ref 12.0–15.0)
MCV: 88.3 fL (ref 78.0–100.0)
Platelets: 298 10*3/uL (ref 150–400)
RBC: 4.18 MIL/uL (ref 3.87–5.11)
WBC: 11.5 10*3/uL — ABNORMAL HIGH (ref 4.0–10.5)

## 2010-07-07 LAB — DIFFERENTIAL
Basophils Relative: 1 % (ref 0–1)
Eosinophils Absolute: 0.3 10*3/uL (ref 0.0–0.7)
Eosinophils Relative: 3 % (ref 0–5)
Monocytes Absolute: 1 10*3/uL (ref 0.1–1.0)
Monocytes Relative: 9 % (ref 3–12)

## 2010-07-07 LAB — CULTURE, ROUTINE-ABSCESS

## 2010-07-07 LAB — COMPREHENSIVE METABOLIC PANEL
AST: 30 U/L (ref 0–37)
Albumin: 4 g/dL (ref 3.5–5.2)
Alkaline Phosphatase: 119 U/L — ABNORMAL HIGH (ref 39–117)
CO2: 25 mEq/L (ref 19–32)
Chloride: 99 mEq/L (ref 96–112)
GFR calc Af Amer: 42 mL/min — ABNORMAL LOW (ref >60)
Potassium: 3.7 mEq/L (ref 3.5–5.1)
Total Bilirubin: 0.6 mg/dL (ref 0.3–1.2)

## 2010-07-28 LAB — CBC
HCT: 36 % (ref 36.0–46.0)
Hemoglobin: 12.2 g/dL (ref 12.0–15.0)
MCV: 89.6 fL (ref 78.0–100.0)
WBC: 10.6 10*3/uL — ABNORMAL HIGH (ref 4.0–10.5)

## 2010-07-28 LAB — POCT CARDIAC MARKERS
CKMB, poc: 1.7 ng/mL (ref 1.0–8.0)
Myoglobin, poc: 96.6 ng/mL (ref 12–200)
Troponin i, poc: <0.05 ng/mL (ref 0.00–0.09)
Troponin i, poc: <0.05 ng/mL (ref 0.00–0.09)

## 2010-07-28 LAB — DIFFERENTIAL
Eosinophils Absolute: 0.2 10*3/uL (ref 0.0–0.7)
Eosinophils Relative: 2 % (ref 0–5)
Lymphocytes Relative: 36 % (ref 12–46)
Lymphs Abs: 3.8 10*3/uL (ref 0.7–4.0)
Monocytes Absolute: 0.9 10*3/uL (ref 0.1–1.0)
Monocytes Relative: 8 % (ref 3–12)

## 2010-07-28 LAB — BASIC METABOLIC PANEL
Chloride: 108 mEq/L (ref 96–112)
GFR calc non Af Amer: 36 mL/min — ABNORMAL LOW (ref >60)
Potassium: 3.2 mEq/L — ABNORMAL LOW (ref 3.5–5.1)
Sodium: 142 mEq/L (ref 135–145)

## 2010-09-10 NOTE — Op Note (Signed)
NAME:  Tammy Hurley, Tammy Hurley                        ACCOUNT NO.:  [id number]   MEDICAL RECORD NO.:  [id number]                   PATIENT TYPE:  AMB   LOCATION:  DAY                                  FACILITY:  [facility]   PHYSICIAN:  Lionel December, M.D.                 DATE OF BIRTH:  10-09-1945   DATE OF PROCEDURE:  04/26/2002  DATE OF DISCHARGE:                                 OPERATIVE REPORT   PROCEDURE PERFORMED:  Diagnostic esophagogastroduodenoscopy followed by  screening colonoscopy.   HISTORY OF PRESENT ILLNESS:  The patient is a 65 year old African American  female who had an episode a few weeks ago where she threw up some blood.  She felt it was coming from her upper GI tract.  She saw Dr. Lodema Hong.  She  had been on over-the-counter medicine which had ibuprofen.  It was  discontinued.  She has not had anymore episodes.  She does have a history of  a  bleeding duodenal ulcer in the past.  She was treated for Helicobacter  pylori gastritis about four years ago.  She is undergoing  esophagogastroduodenoscopy to find out she has a recurrent peptic ulcer  disease followed by screening colonoscopy.  The procedure is reviewed with  the patient.  Informed consent was obtained.   PREMEDICATION:  Cetacaine spray for pharyngeal topic anesthesia, Demerol 50  mg IV, Versed 4 mg IV in divided dose.   INSTRUMENT:  Olympus video system.   FINDINGS:  The procedure performed in the endoscopy suite.  The patient's  vital signs and O2 saturations were monitored during the procedure and  remained stable.  The patient was placed in the left lateral decubitus  position and the endoscope was passed through the oropharynx without any  difficulty into the esophagus.   The esophagus:  The mucosa of the esophagus was normal however, the  squamocolumnar junction was surveyed and there was a tiny single projection  of gastric mucosa of the left side, but not enough for Barrett's esophagus.  Picture was  taken for the record.  No ring or stricture was noted.   Stomach:  It was empty and distended very well with insufflation.  The folds  in the proximal stomach were normal.  Examination of the mucosa of the  gastric body, antrum, pyloric channel as well as the angularis and the  fundus were normal.   Duodenum:  Examination of the bulb and second part of the duodenum were  normal.  The endoscope was withdrawn.  The patient was prepared for  procedure #2.   PROCEDURE PERFORMED:  Total colonoscopy.   DESCRIPTION OF PROCEDURE:  The rectal examination was performed.  She had  soft solid skin tags.  She also had some hypopigmentation follicular-like in  the perianal area.  Digital exam was normal.  The scope was passed to the  rectum and observation of the sigmoid colon beyond.  The  preparation was  satisfactory.  The scope was passed to the cecum which was identified by the  ileocecal valve and appendiceal orifice.  Pictures were taken for the  record.  As the scope was withdrawn, the colonic mucosa was once again  carefully examined.  It was normal except for a single tiny diverticulum at  the sigmoid colon.  The rectal mucosa was normal.  The scope was  retroflexed, examined and the anorectal junction and small hemorrhoids were  noted below the dentate line.  The endoscope was straightened and withdrawn.  The patient tolerated the procedure well.   FINAL DIAGNOSES:  Normal esophagogastroduodenoscopy except at the  esophagogastric junction.  No evidence of peptic ulcer disease.   Normal colonoscopy except single small diverticulum at the sigmoid colon and  small external hemorrhoids.   RECOMMENDATIONS:  I would suggest that she use NSAIDs sparingly only when  she needs it.  If she has another episode of bleeding, she will contact Dr.  Lodema Hong.   She should complete yearly Hemoccults and consider the next screening exam  in 10 years from now.                                                  Lionel December, M.D.    NR/MEDQ  D:  04/26/2002  T:  04/26/2002  Job:  [id number]   cc:   Milus Mallick. Lodema Hong, M.D.  [address]  [place]  [place] [address]  Fax: [phone]

## 2010-09-10 NOTE — Consult Note (Signed)
NAME:  Tammy Hurley, VINE                        ACCOUNT NO.:  [id number]   MEDICAL RECORD NO.:  [id number]                   PATIENT TYPE:   LOCATION:                                       FACILITY:   PHYSICIAN:  Lionel December, M.D.                 DATE OF BIRTH:  04-21-1946   DATE OF CONSULTATION:  04/23/2002  DATE OF DISCHARGE:                                   CONSULTATION   PRESENTING COMPLAINT:  Recent episode of hematemesis.   HISTORY OF PRESENT ILLNESS:  The patient is a 65 year old African-American  female patient of Dr. Anthony Sar who is here for a scheduled visit.  She was  last seen in January 2002 and scheduled for screening colonoscopy but she  got sick and this has not been performed yet.  At that time she was also  treated for H. pylori gastritis.  She was given Helidac.   She states she was in her usual state of health until around Thanksgiving  when she had a one-hour episode where she started spitting up fresh blood.  She describes to me that she had regurgitation.  She could not lie flat  because it made the symptoms worse.  She did not vomit any coffee grounds.  She did have some coughing but no chest pain or dyspnea.  She was seen by  Dr. Lodema Hong a few days later.  Her H&H was normal.  Dr. Lodema Hong noted that  she was taking OTC medication (Vibrant) which had ibuprofen and she asked  her to discontinue the medication.  She denies melena or rectal bleeding.  She has noticed some discomfort across her upper abdomen and she is  concerned the ulcers are back.  She has a good appetite.  She denies  anorexia, weight loss, frequent heartburn, dysphagia.  Her bowels generally  move daily.   MEDICATIONS:  1. Toprol-XL 200 mg q.d.  2. Maxzide 50 mg q.d.  3. Prevacid 30 mg q.a.m.  4. ASA 81 mg q.d.  5. Herbal medications including garlic, potassium, CN, TNP joint support,     vitamin E, food enzyme, vitamin C with minerals b.i.d., and PBS six     tablets daily.   PAST MEDICAL HISTORY:  History of GI bleed secondary to bulbar ulcer  diagnosed in October 1992.  CLOtest at that time was negative.  H. pylori  was positive in 1999 but she was not treated until last year.  She has  hypertension.  Previous surgeries include hysterectomy in 1979 and  cholecystectomy in 1992.   ALLERGIES:  BIAXIN which caused tongue swelling.   FAMILY HISTORY:  Significant for polyps in her mother and first cousin has  had colon cancer.   SOCIAL HISTORY:  She is divorced and has three children.  One daughter is  diabetic.  She has not smoked in 25 years and she does not drink alcohol.  She works at [employer]   PHYSICAL EXAMINATION:  GENERAL:  A pleasant, moderately-obese African-  American female who is in no acute distress.  VITAL SIGNS:  She weighs 250 pounds; she is 5 feet 7 inches tall.  Pulse 68  per minute, blood pressure 136/74; she is afebrile.  HEENT:  Conjunctivae are pink, sclerae are nonicteric.  Oropharyngeal mucosa  is normal.  NECK:  Without masses or thyromegaly.  CARDIAC:  Regular rhythm.  Normal S1 and S2.  No murmur or gallop noted.  LUNGS:  Clear to auscultation.  ABDOMEN:  Full.  Bowel sounds are normal.  Palpation reveals mild tenderness  at LLQ but no organomegaly or masses noted.  RECTAL:  Reveals soft sentinel skin tags.  Stool is guaiac negative.  EXTREMITIES:  No peripheral edema or clubbing noted but she does have some  vitiligo or hypopigmentation involving the right hand.   ASSESSMENT:  The patient experienced a recent episode of spitting up of  blood.  Based on her history, this would appear to be hematemesis.  She  does have history of peptic ulcer disease with a GI bleed in the past.  She  had been on OTC ibuprofen prior to onset of this symptom.  There is nothing  to suggest epistaxis or bleeding due to sinusitis or hemoptysis.   The risk for CRC is somewhat above average because of family history and she  needs to  have a screening colonoscopy.   RECOMMENDATIONS:  Diagnostic esophagogastroduodenoscopy along with screening  colonoscopy to be performed at [facility] this week.  I have reviewed both the  procedure and risks with the patient and she is agreeable.   I would like to thank Dr. Lodema Hong for the opportunity to participate in the  care of this nice lady.                                               Lionel December, M.D.    NR/MEDQ  D:  04/23/2002  T:  04/23/2002  Job:  [id number]   cc:   Day Surgery   Milus Mallick. Lodema Hong, M.D.  [address]  [place]  [place] [address]  Fax: [phone]

## 2010-09-16 ENCOUNTER — Encounter: Payer: Self-pay | Admitting: Family Medicine

## 2010-09-21 ENCOUNTER — Ambulatory Visit: Payer: Medicare Other | Admitting: Family Medicine

## 2010-10-02 ENCOUNTER — Other Ambulatory Visit: Payer: Self-pay | Admitting: Family Medicine

## 2010-10-04 ENCOUNTER — Encounter: Payer: Self-pay | Admitting: Family Medicine

## 2010-10-07 ENCOUNTER — Encounter: Payer: Self-pay | Admitting: Family Medicine

## 2010-10-11 ENCOUNTER — Ambulatory Visit: Payer: Medicare Other | Admitting: Family Medicine

## 2010-10-13 ENCOUNTER — Other Ambulatory Visit: Payer: Self-pay | Admitting: Family Medicine

## 2010-11-12 ENCOUNTER — Other Ambulatory Visit: Payer: Self-pay | Admitting: Family Medicine

## 2010-11-17 ENCOUNTER — Other Ambulatory Visit: Payer: Self-pay | Admitting: Family Medicine

## 2010-12-01 ENCOUNTER — Encounter: Payer: Self-pay | Admitting: Family Medicine

## 2010-12-02 ENCOUNTER — Ambulatory Visit (INDEPENDENT_AMBULATORY_CARE_PROVIDER_SITE_OTHER): Payer: Medicare Other | Admitting: Family Medicine

## 2010-12-02 ENCOUNTER — Encounter: Payer: Self-pay | Admitting: Family Medicine

## 2010-12-02 VITALS — BP 148/84 | HR 74 | Ht 68.0 in | Wt 257.1 lb

## 2010-12-02 DIAGNOSIS — I471 Supraventricular tachycardia: Secondary | ICD-10-CM

## 2010-12-02 DIAGNOSIS — I10 Essential (primary) hypertension: Secondary | ICD-10-CM

## 2010-12-02 DIAGNOSIS — I4510 Unspecified right bundle-branch block: Secondary | ICD-10-CM

## 2010-12-02 DIAGNOSIS — E876 Hypokalemia: Secondary | ICD-10-CM

## 2010-12-02 LAB — BASIC METABOLIC PANEL
CO2: 21 mEq/L (ref 19–32)
Calcium: 10.9 mg/dL — ABNORMAL HIGH (ref 8.4–10.5)
Creat: 1.42 mg/dL — ABNORMAL HIGH (ref 0.50–1.10)

## 2010-12-02 NOTE — Assessment & Plan Note (Signed)
Check potassium

## 2010-12-02 NOTE — Assessment & Plan Note (Addendum)
This is likely the cause of patient's palpitations. She has not had any recent illness. She does have slightly peaked T waves therefore recheck her potassium especially with the dosing that she states she has been on for quite some time. Currently she is in sinus rhythm. She will continue her Toprol. I have consulted with cardiology she will be seen next week. She was given red flags to seek care.

## 2010-12-02 NOTE — Progress Notes (Signed)
  Subjective:    Patient ID: Tammy Hurley, female    DOB: 1946/01/22, 65 y.o.   MRN: [id number]  HPI  Palpitations- patient has history of SVT this was initially diagnosed and 20/10. At that time patient was seen in the ED he was converted with adenosine. She was started on Toprol by cardiology at that time. Over the past 2 weeks she's had 2 episodes of chest pounding/palpitations. Note she was at the beach on vacation. Each episode lasted less than 5 minutes. She denies any shortness of breath, sharp chest pain, radiation of pain nausea ,diaphoresis, fever, or recent illness. She states she may have missed one or 2 doses of her medication but this was prior to going to her beach trip  where these episodes occurred. Her last episode was one week ago. She is concerned her potassium has not been checked in a while. She states that she takes potassium 5 times a day. She has a potassium bottle with her which is over-the-counter which states 99 mg. Her med list states Klor-Con 20 mEq twice a day. Review of Systems  GEN- + fatigue,denies fever, weight loss,weakness, recent illness HEENT-  change in vision,  CVS- denies chest pain,+palpitations RESP- denies SOB, cough, wheeze ABD- denies N/V, change in stools, abd pain Ext- occ foot edema Neuro- denies headache, dizziness, syncope, seizure activity      Objective:   Physical Exam GEN- NAD, alert and oriented x3, obese Neck- Supple, no thryomegaly, no JVD CVS- RRR, no murmur RESP-CTAB EXT- trace pedal Pulses- Radial, DP- 2+  EKG- NSR, HR 68, RBBB, normal PR interval, slightly peaked T waves in lateral leads, there Q waves seen in leads 2 and aVF      Assessment & Plan:   I reviewed the EKG via telephone with Dr. Daleen Squibb at [facility] cardiology. Patient will have upcoming appointment.

## 2010-12-02 NOTE — Patient Instructions (Addendum)
Continue your Toprol daily If you have another episode,difficulty breathing, then go over to [facility] to be checked I will check your potassium level today Appt with cardiology Monday 11:20am  Schedule your follow-up visit for your diabetes with Dr. Lodema Hong within the next month

## 2010-12-02 NOTE — Assessment & Plan Note (Signed)
EKG today shows new right bundle branch block. Of note patient had a right bundle branch block what she had her first episode of SVT approximately 2 years ago. On subsequent EKGs after that resolution it disappeared. Lower for her to cardiology to have her LV function evaluated. There are no ST changes to suggest acute infarct.

## 2010-12-03 ENCOUNTER — Other Ambulatory Visit: Payer: Self-pay | Admitting: Family Medicine

## 2010-12-03 DIAGNOSIS — E119 Type 2 diabetes mellitus without complications: Secondary | ICD-10-CM

## 2010-12-06 ENCOUNTER — Ambulatory Visit (INDEPENDENT_AMBULATORY_CARE_PROVIDER_SITE_OTHER): Payer: 59 | Admitting: Adult Health

## 2010-12-06 ENCOUNTER — Encounter: Payer: Self-pay | Admitting: Adult Health

## 2010-12-06 DIAGNOSIS — E669 Obesity, unspecified: Secondary | ICD-10-CM

## 2010-12-06 DIAGNOSIS — I471 Supraventricular tachycardia: Secondary | ICD-10-CM

## 2010-12-06 DIAGNOSIS — I10 Essential (primary) hypertension: Secondary | ICD-10-CM

## 2010-12-06 DIAGNOSIS — R000 Tachycardia, unspecified: Secondary | ICD-10-CM

## 2010-12-06 DIAGNOSIS — R002 Palpitations: Secondary | ICD-10-CM

## 2010-12-06 NOTE — Assessment & Plan Note (Signed)
She has lost 10 lbs since we saw her last. She goes to the [facility] to swim but has not had the energy lately. Will review stress test results and make further recommendations. She is to adhere to a low fat, diabetic diet.

## 2010-12-06 NOTE — Patient Instructions (Signed)
Your physician recommends that you continue on your current medications as directed. Please refer to the Current Medication list given to you today.  Your physician has requested that you have en exercise stress myoview. For further information please visit [email or web address]. Please follow instruction sheet, as given.  Your physician recommends that you schedule a follow-up appointment in: after test  

## 2010-12-06 NOTE — Assessment & Plan Note (Signed)
Currently elevated on this visit. She was rushing because she was late coming in. Usually runs in the 140's at home and on follow-up visits. Will reassess on next visit.

## 2010-12-06 NOTE — Assessment & Plan Note (Signed)
She has had recurrence of this when she did not remember to take metoprolol a couple of times. Otherwise, no "breakthrough" issues.  She has not had a stress test with multiple cardiac risk factors.  Will plan 2 day stress myoview for evaluation,diagnostic/prognositc purposes.

## 2010-12-06 NOTE — Progress Notes (Signed)
HPI:  Tammy Hurley is a obese 65 y/o patient of Dr. Dietrich Pates we are seeing on follow-up after 2 year absence.  She was to come on prn basis only. She had been treated for PSVT and is on metoprolol 200 mg daily. She also has a history of diabetes, hypertension. Hypercholesterolemia.  She states she had a cardiac catheterization in 1992 that was ok. She has had no further stress tests or cardiac testing since that time.   She has had no complaints of recurrent SVT until 2 weeks ago when she forgot to take the metoprolol a couple of times.  When she takes her medications she has no issues. She is feeling more fatigued, but has not had CP or shortness of breath.  Allergies  Allergen Reactions  . Clarithromycin     Current Outpatient Prescriptions  Medication Sig Dispense Refill  . aspirin (ADULT ASPIRIN EC LOW STRENGTH) 81 MG EC tablet Take 81 mg by mouth daily.        . Cholecalciferol (VITAMIN D3) 1000 UNIT capsule Take 1,000 Units by mouth daily. Two tabs by mouth once daily       . Garlic 1000 MG CAPS Take by mouth daily. Take one capsule by mouth once daily       . glipiZIDE (GLUCOTROL) 5 MG tablet Take 5 mg by mouth 2 (two) times daily before a meal. Take one tablet by mouth every morning       . glucose blood (ONE TOUCH TEST STRIPS) test strip 1 each by Other route as needed. Use as instructed       . KLOR-CON M20 20 MEQ tablet TAKE ONE TABLET BY MOUTH TWICE A DAY  60 tablet  0  . lansoprazole (PREVACID) 30 MG capsule Take 30 mg by mouth daily. One tab by mouth once daily       . Magnesium-Calcium-Folic Acid (MAGNEBIND 400 PO) Take by mouth 2 (two) times daily.        . metFORMIN (GLUCOPHAGE-XR) 500 MG 24 hr tablet TAKE TWO (2) TABLETS BY MOUTH           2 TIMES DAILY  120 tablet  3  . metoprolol (TOPROL-XL) 200 MG 24 hr tablet TAKE ONE (1) TABLET BY MOUTH EVERY      DAY  90 tablet  0  . ONETOUCH DELICA LANCETS MISC by Does not apply route.        . Potassium 99 MG TABS Take by mouth.        .  triamterene-hydrochlorothiazide (MAXZIDE) 75-50 MG per tablet TAKE ONE (1) TABLET BY MOUTH EVERY      DAY  90 tablet  1  . Omega-3 Fatty Acids (FISH OIL) 1000 MG CAPS Take by mouth daily. One capsule by mouth daily           Past Medical History  Diagnosis Date  . Supraventricular tachycardia   . Obesity   . Hypertension   . Diabetes mellitus, type 2   . Gastroesophageal reflux disease   . Back pain   . Headache   . Osteoarthritis     Past Surgical History  Procedure Date  . Appendectomy   . Cholecystectomy   . Vesicovaginal fistula closure w/ tah   . Left oophorectomy   . 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 Right eye prostheslis 03/07  . Retinal detachment repair w/ scleral buckle le   . Refiting of r prosthesis 03/09    [id number] of systems  complete and found to be negative unless listed above PHYSICAL EXAM BP 164/87  Pulse 76  Resp 18  Ht 5\' 8"  (1.727 m)  Wt 256 lb 1.3 oz (116.157 kg)  BMI 38.94 kg/m2  SpO2 98%  General: Well developed, well nourished, in no acute distress Head: Eyes PERRLA, No xanthomas.   Normal cephalic and atramatic  Lungs: Clear bilaterally to auscultation and percussion. Heart: HRRR S1 S2,  Pulses are 2+ & equal.            No carotid bruit. No JVD.  No abdominal bruits. No femoral bruits. Abdomen: Bowel sounds are positive, abdomen soft and non-tender without masses or                  Hernia's noted. Msk:  Back normal, normal gait. Normal strength and tone for age. Extremities: No clubbing, cyanosis or edema.  DP +1 Neuro: Alert and oriented X 3. Psych:  Good affect, responds appropriately  EKG:NSR, RBBB, rate of 74 bpm.  ASSESSMENT AND PLAN

## 2010-12-07 ENCOUNTER — Telehealth: Payer: Self-pay | Admitting: Family Medicine

## 2010-12-07 DIAGNOSIS — E119 Type 2 diabetes mellitus without complications: Secondary | ICD-10-CM

## 2010-12-08 MED ORDER — GLUCOSE BLOOD VI STRP: ORAL_STRIP | Status: DC

## 2010-12-08 MED ORDER — GLUCOSE BLOOD VI STRP: ORAL_STRIP | Status: AC

## 2010-12-08 NOTE — Telephone Encounter (Signed)
Sent as requested.

## 2010-12-10 ENCOUNTER — Encounter: Payer: Self-pay | Admitting: Family Medicine

## 2010-12-13 NOTE — Progress Notes (Signed)
Addended by: Augusto Gamble on: 12/13/2010 01:24 PM   Modules accepted: Orders

## 2010-12-14 ENCOUNTER — Encounter: Payer: Self-pay | Admitting: *Deleted

## 2010-12-15 ENCOUNTER — Encounter (HOSPITAL_COMMUNITY)
Admission: RE | Admit: 2010-12-15 | Discharge: 2010-12-15 | Disposition: A | Discharge status: Home / Self Care | Payer: Medicare Other | Source: Ambulatory Visit | Attending: Adult Health | Admitting: Adult Health

## 2010-12-15 ENCOUNTER — Ambulatory Visit (INDEPENDENT_AMBULATORY_CARE_PROVIDER_SITE_OTHER): Payer: Medicare Other | Admitting: *Deleted

## 2010-12-15 ENCOUNTER — Encounter (HOSPITAL_COMMUNITY): Payer: Self-pay | Admitting: Cardiology

## 2010-12-15 DIAGNOSIS — E119 Type 2 diabetes mellitus without complications: Secondary | ICD-10-CM | POA: Insufficient documentation

## 2010-12-15 DIAGNOSIS — I471 Supraventricular tachycardia: Secondary | ICD-10-CM

## 2010-12-15 DIAGNOSIS — R000 Tachycardia, unspecified: Secondary | ICD-10-CM

## 2010-12-15 DIAGNOSIS — I10 Essential (primary) hypertension: Secondary | ICD-10-CM | POA: Insufficient documentation

## 2010-12-15 DIAGNOSIS — E785 Hyperlipidemia, unspecified: Secondary | ICD-10-CM | POA: Insufficient documentation

## 2010-12-15 DIAGNOSIS — R0789 Other chest pain: Secondary | ICD-10-CM | POA: Insufficient documentation

## 2010-12-15 DIAGNOSIS — R002 Palpitations: Secondary | ICD-10-CM

## 2010-12-15 MED ORDER — TECHNETIUM TC 99M TETROFOSMIN IV KIT
30.0000 | PACK | Freq: Once | INTRAVENOUS | Status: AC | PRN
  Administered 2010-12-15: 31 via INTRAVENOUS

## 2010-12-15 NOTE — Progress Notes (Signed)
Stress Lab Nurses Notes - Tammy Hurley  Tammy Hurley 12/15/2010  Reason for doing test: Arrhythmia/Palpitations and HTN  Type of test: Stress Myoview 2 day  Nurse performing test: Parke Poisson, RN  Nuclear Medicine Tech: Lyndel Pleasure  Echo Tech: Not Applicable  MD performing test: Ival Bible, MD & Joni Reining, NP  Family MD: Lodema Hong  Test explained and consent signed: yes  IV started: 22g jelco, Saline lock flushed and No redness or edema  Symptoms: SOB and fatigue  Treatment/Intervention: None  Reason test stopped: fatigue  After recovery IV was: Discontinued via X-ray tech and No redness or edema  Patient to return to Nuc. Med at :  11am  Patient discharged: Home  Patient's Condition upon discharge was: stable  Comments: Peak  BP 192/72, recovery BP 148/68. Symptoms resolved in recovery  [name] T

## 2010-12-16 ENCOUNTER — Encounter (HOSPITAL_COMMUNITY)
Admission: RE | Admit: 2010-12-16 | Discharge: 2010-12-16 | Disposition: A | Discharge status: Home / Self Care | Payer: Medicare Other | Source: Ambulatory Visit | Attending: Adult Health | Admitting: Adult Health

## 2010-12-16 MED ORDER — TECHNETIUM TC 99M TETROFOSMIN IV KIT
25.0000 | PACK | Freq: Once | INTRAVENOUS | Status: AC | PRN
  Administered 2010-12-16: 24.4 via INTRAVENOUS

## 2010-12-20 ENCOUNTER — Other Ambulatory Visit: Payer: Self-pay | Admitting: Family Medicine

## 2010-12-20 ENCOUNTER — Ambulatory Visit (INDEPENDENT_AMBULATORY_CARE_PROVIDER_SITE_OTHER): Payer: Medicare Other | Admitting: Adult Health

## 2010-12-20 ENCOUNTER — Encounter: Payer: Self-pay | Admitting: Adult Health

## 2010-12-20 DIAGNOSIS — R5383 Other fatigue: Secondary | ICD-10-CM

## 2010-12-20 DIAGNOSIS — I10 Essential (primary) hypertension: Secondary | ICD-10-CM

## 2010-12-20 DIAGNOSIS — R5381 Other malaise: Secondary | ICD-10-CM

## 2010-12-20 NOTE — Patient Instructions (Signed)
Your physician recommends you to continue current medications as listed. Your physician recommends you to follow up as needed.

## 2010-12-20 NOTE — Progress Notes (Signed)
HPI:  Tammy Hurley is a obese 65 y/o patient of Dr. Dietrich Pates we are seeing on follow-up after 2 year absence.  She was to come on prn basis only. She had been treated for PSVT and is on metoprolol 200 mg daily. She also has a history of diabetes, hypertension. Hypercholesterolemia.  She states she had a cardiac catheterization in 1992 that was ok. She has had no further stress tests or cardiac testing since that time.   She has had no complaints of recurrent SVT until 2 weeks ago when she forgot to take the metoprolol a couple of times.  When she takes her medications she has no issues. She is feeling more fatigued, but has not had CP or shortness of breath.   On last visit I ordered stress test for diagnostic/prognostic purposes for increased fatigue.  She is here for results. She has taken her metoprolol consistently since last visit. Allergies  Allergen Reactions  . Clarithromycin     Current Outpatient Prescriptions  Medication Sig Dispense Refill  . aspirin (ADULT ASPIRIN EC LOW STRENGTH) 81 MG EC tablet Take 81 mg by mouth daily.        . Cholecalciferol (VITAMIN D3) 1000 UNIT capsule Take 1,000 Units by mouth daily. Two tabs by mouth once daily       . Garlic 1000 MG CAPS Take by mouth daily. Take one capsule by mouth once daily       . glipiZIDE (GLUCOTROL) 5 MG tablet Take 5 mg by mouth daily. Take one tablet by mouth every morning      . glucose blood (ONE TOUCH TEST STRIPS) test strip Use as instructed  100 each  2  . glucose blood (PRODIGY AUTOCODE TEST) test strip Use as instructed  100 each  12  . KLOR-CON M20 20 MEQ tablet TAKE ONE TABLET BY MOUTH TWICE A DAY  60 tablet  0  . lansoprazole (PREVACID) 30 MG capsule Take 30 mg by mouth daily. One tab by mouth once daily       . Magnesium-Calcium-Folic Acid (MAGNEBIND 400 PO) Take by mouth 2 (two) times daily.        . metFORMIN (GLUCOPHAGE-XR) 500 MG 24 hr tablet TAKE TWO (2) TABLETS BY MOUTH           2 TIMES DAILY  120 tablet  3  .  metoprolol (TOPROL-XL) 200 MG 24 hr tablet TAKE ONE (1) TABLET BY MOUTH EVERY      DAY  90 tablet  0  . Omega-3 Fatty Acids (FISH OIL) 1000 MG CAPS Take by mouth daily. One capsule by mouth daily         . ONETOUCH DELICA LANCETS MISC by Does not apply route.        . Potassium 99 MG TABS Take by mouth.        . triamterene-hydrochlorothiazide (MAXZIDE) 75-50 MG per tablet TAKE ONE (1) TABLET BY MOUTH EVERY      DAY  90 tablet  1    Past Medical History  Diagnosis Date  . Supraventricular tachycardia   . Obesity   . Hypertension   . Diabetes mellitus, type 2   . Gastroesophageal reflux disease   . Back pain   . Headache   . Osteoarthritis     Past Surgical History  Procedure Date  . Appendectomy   . Cholecystectomy   . Vesicovaginal fistula closure w/ tah   . Left oophorectomy   . 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 Right eye prostheslis 03/07  . Retinal detachment repair w/ scleral buckle le   . Refiting of r prosthesis 03/09    ZOX:[id number] of systems complete and found to be negative unless listed above PHYSICAL EXAM BP 149/79  Pulse 71  Resp 18  Ht 5\' 8"  (1.727 m)  Wt 259 lb (117.482 kg)  BMI 39.38 kg/m2  General: Well developed, well nourished, in no acute distress Head: Eyes PERRLA, No xanthomas.   Normal cephalic and atramatic  Lungs: Clear bilaterally to auscultation and percussion. Heart: HRRR S1 S2,  Pulses are 2+ & equal.            No carotid bruit. No JVD.  No abdominal bruits. No femoral bruits. Abdomen: Bowel sounds are positive, abdomen soft and non-tender without masses or                  Hernia's noted. Msk:  Back normal, normal gait. Normal strength and tone for age. Extremities: No clubbing, cyanosis or edema.  DP +1 Neuro: Alert and oriented X 3. Psych:  Good affect, responds appropriately  EKG:NSR, RBBB, rate of 74 bpm.  ASSESSMENT AND PLAN

## 2010-12-20 NOTE — Assessment & Plan Note (Signed)
Stress test result demonstrated no evidence of ischemia but she did have hypertensive response to exercise. Of note, she did not take medications prior to stress test. I have discussed the test results with her and have sent a copy to PCP, Dr. Lodema Hong. She is advised to increase her exercise and begin to lose weight again, as her heart function is normal. She verbalizes understanding.

## 2010-12-20 NOTE — Assessment & Plan Note (Signed)
Moderately controlled at present for diabetic patient. Would recommend ACE inhibitor, low dose to keep BP at least in the 140's. There have been studies to support higher BP in the elderly patient at baseline is better tolerated than in younger patients, to avoid dizziness and weakness.

## 2010-12-21 ENCOUNTER — Encounter: Payer: Self-pay | Admitting: Family Medicine

## 2010-12-22 ENCOUNTER — Ambulatory Visit (INDEPENDENT_AMBULATORY_CARE_PROVIDER_SITE_OTHER): Payer: Medicare Other | Admitting: Family Medicine

## 2010-12-22 ENCOUNTER — Other Ambulatory Visit: Payer: Self-pay | Admitting: Family Medicine

## 2010-12-22 ENCOUNTER — Encounter: Payer: Self-pay | Admitting: Family Medicine

## 2010-12-22 ENCOUNTER — Other Ambulatory Visit: Payer: Self-pay

## 2010-12-22 DIAGNOSIS — I471 Supraventricular tachycardia: Secondary | ICD-10-CM

## 2010-12-22 DIAGNOSIS — N3090 Cystitis, unspecified without hematuria: Secondary | ICD-10-CM | POA: Insufficient documentation

## 2010-12-22 DIAGNOSIS — IMO0002 Reserved for concepts with insufficient information to code with codable children: Secondary | ICD-10-CM

## 2010-12-22 DIAGNOSIS — E119 Type 2 diabetes mellitus without complications: Secondary | ICD-10-CM

## 2010-12-22 DIAGNOSIS — Z113 Encounter for screening for infections with a predominantly sexual mode of transmission: Secondary | ICD-10-CM

## 2010-12-22 DIAGNOSIS — I10 Essential (primary) hypertension: Secondary | ICD-10-CM

## 2010-12-22 DIAGNOSIS — E669 Obesity, unspecified: Secondary | ICD-10-CM

## 2010-12-22 LAB — POCT URINALYSIS DIPSTICK
Bilirubin, UA: NEGATIVE
Ketones, UA: NEGATIVE
Spec Grav, UA: 1.015
pH, UA: 5.5

## 2010-12-22 MED ORDER — GLIPIZIDE 5 MG PO TABS: 5.0000 mg | ORAL_TABLET | Freq: Every day | ORAL | Status: DC

## 2010-12-22 MED ORDER — TRIAMTERENE-HCTZ 75-50 MG PO TABS: 1.0000 | ORAL_TABLET | Freq: Every day | ORAL | Status: DC

## 2010-12-22 MED ORDER — METOPROLOL SUCCINATE ER 200 MG PO TB24: 200.0000 mg | ORAL_TABLET | Freq: Every day | ORAL | Status: DC

## 2010-12-22 MED ORDER — LANSOPRAZOLE 30 MG PO CPDR: 30.0000 mg | DELAYED_RELEASE_CAPSULE | Freq: Every day | ORAL | Status: DC

## 2010-12-22 MED ORDER — POTASSIUM CHLORIDE CRYS ER 20 MEQ PO TBCR: 20.0000 meq | EXTENDED_RELEASE_TABLET | Freq: Two times a day (BID) | ORAL | Status: DC

## 2010-12-22 MED ORDER — MAGNESIUM-CALCIUM-FOLIC ACID 400-200-1 MG PO TABS: 1.0000 | ORAL_TABLET | Freq: Two times a day (BID) | ORAL | Status: DC

## 2010-12-22 NOTE — Progress Notes (Signed)
HSV 2 IGG added to labs

## 2010-12-22 NOTE — Assessment & Plan Note (Signed)
1 week h/o stinging and burning on urination, UA is abnormal will wait on c./s to treat with antibiottic

## 2010-12-22 NOTE — Assessment & Plan Note (Signed)
Recurrent episodes when pt does not take her beta blocker, which is expected, again educated about med compliance

## 2010-12-22 NOTE — Progress Notes (Signed)
  Subjective:    Patient ID: Tammy Hurley, female    DOB: 1945-12-06, 65 y.o.   MRN: [id number]  HPI The PT is here for follow up and re-evaluation of chronic medical conditions, medication management and review of any available recent lab and radiology data.  Preventive health is updated, specifically  Cancer screening and Immunization.   Questions or concerns regarding consultations or procedures which the PT has had in the interim are  Addressed. Was recently evaluated by cardiology and had a neg stress test. The PT denies any adverse reactions to current medications since the last visit  except she reports increased blood sugars with the epidural injections  States she is aware her blood sugars are not as they should be and she has been testing erratically. She is interested in attending diabetic ed class     Review of Systems See HPI Denies recent fever or chills. Denies sinus pressure, nasal congestion, ear pain or sore throat. Denies chest congestion, productive cough or wheezing. Denies chest pains, palpitations and leg swelling Denies abdominal pain, nausea, vomiting,diarrhea or constipation.   Denies dysuria, frequency, hesitancy or incontinence.  Denies headaches, seizures, numbness, or tingling. Denies depression, anxiety or insomnia. Denies skin break down or rash.        Objective:   Physical Exam Patient alert and oriented and in no cardiopulmonary distress.  HEENT: No facial asymmetry, EOMI, no sinus tenderness,  oropharynx pink and moist.  Neck supple no adenopathy.  Chest: Clear to auscultation bilaterally.  CVS: S1, S2 no murmurs, no S3.  ABD: Soft non tender. Bowel sounds normal.  Ext: No edema  MS: decreased  ROM spine,adequate in  shoulders, hips and knees.  Skin: Intact, no ulcerations or rash noted.  Psych: Good eye contact, normal affect. Memory intact not anxious or depressed appearing.  CNS: CN 2-12 intact, power, tone and sensation  normal throughout.        Assessment & Plan:

## 2010-12-22 NOTE — Assessment & Plan Note (Signed)
Improved, pt has had epidurals with good results, states sugars have been high with the injection, this morning 200

## 2010-12-22 NOTE — Assessment & Plan Note (Signed)
Controlled, no change in medication  

## 2010-12-22 NOTE — Patient Instructions (Signed)
F/u in 3 months.  It is important that you exercise regularly at least 30 minutes 5 times a week. If you develop chest pain, have severe difficulty breathing, or feel very tired, stop exercising immediately and seek medical attention   A healthy diet is rich in fruit, vegetables and whole grains. Poultry fish, nuts and beans are a healthy choice for protein rather then red meat. A low sodium diet and drinking 64 ounces of water daily is generally recommended. Oils and sweet should be limited. Carbohydrates especially for those who are diabetic or overweight, should be limited to 34-45 gram per meal. It is important to eat on a regular schedule, at least 3 times daily. Snacks should be primarily fruits, vegetables or nuts.   Pls test blood sugars once daily and if not in guidelines, please call for sooner appt.  BEFORE MEAL 90 to 120, 2 hours after meal or bedtime 120 to 170  hBA1C and chem 7 in 3 months, non fasting.  Pls call and go to group class on diabetes as soon as possible

## 2010-12-23 LAB — BASIC METABOLIC PANEL
CO2: 22 mEq/L (ref 19–32)
Chloride: 103 mEq/L (ref 96–112)
Creat: 1.29 mg/dL — ABNORMAL HIGH (ref 0.50–1.10)

## 2010-12-23 LAB — LIPID PANEL
HDL: 39 mg/dL — ABNORMAL LOW (ref >39)
LDL Cholesterol: 75 mg/dL (ref 0–99)
Total CHOL/HDL Ratio: 4.4 Ratio

## 2010-12-23 LAB — HEMOGLOBIN A1C: Hgb A1c MFr Bld: 8 % — ABNORMAL HIGH (ref <5.7)

## 2010-12-24 ENCOUNTER — Other Ambulatory Visit: Payer: Self-pay | Admitting: Family Medicine

## 2010-12-24 ENCOUNTER — Other Ambulatory Visit: Payer: Self-pay

## 2010-12-24 DIAGNOSIS — N390 Urinary tract infection, site not specified: Secondary | ICD-10-CM

## 2010-12-24 MED ORDER — CIPROFLOXACIN HCL 500 MG PO TABS: 500.0000 mg | ORAL_TABLET | Freq: Two times a day (BID) | ORAL | Status: AC

## 2010-12-25 LAB — URINE CULTURE: Colony Count: >=100000

## 2010-12-28 ENCOUNTER — Ambulatory Visit: Payer: Medicare Other | Admitting: Cardiology

## 2010-12-28 ENCOUNTER — Telehealth: Payer: Self-pay | Admitting: Family Medicine

## 2010-12-28 NOTE — Telephone Encounter (Signed)
SPOKE WITH PATIENT REGARDING LABS

## 2011-01-04 ENCOUNTER — Encounter: Payer: Self-pay | Admitting: Family Medicine

## 2011-01-05 ENCOUNTER — Encounter: Payer: Self-pay | Admitting: Family Medicine

## 2011-01-05 ENCOUNTER — Ambulatory Visit (INDEPENDENT_AMBULATORY_CARE_PROVIDER_SITE_OTHER): Payer: Medicare Other | Admitting: Family Medicine

## 2011-01-05 VITALS — BP 132/84 | HR 64 | Resp 16 | Ht 67.0 in | Wt 264.1 lb

## 2011-01-05 DIAGNOSIS — M549 Dorsalgia, unspecified: Secondary | ICD-10-CM

## 2011-01-05 DIAGNOSIS — IMO0002 Reserved for concepts with insufficient information to code with codable children: Secondary | ICD-10-CM

## 2011-01-05 DIAGNOSIS — I10 Essential (primary) hypertension: Secondary | ICD-10-CM

## 2011-01-05 DIAGNOSIS — E119 Type 2 diabetes mellitus without complications: Secondary | ICD-10-CM

## 2011-01-05 MED ORDER — PREDNISONE (PAK) 5 MG PO TABS: 5.0000 mg | ORAL_TABLET | ORAL | Status: DC

## 2011-01-05 MED ORDER — KETOROLAC TROMETHAMINE 60 MG/2ML IM SOLN
60.0000 mg | Freq: Once | INTRAMUSCULAR | Status: AC
  Administered 2011-01-05: 60 mg via INTRAMUSCULAR

## 2011-01-05 MED ORDER — IBUPROFEN 800 MG PO TABS: 800.0000 mg | ORAL_TABLET | Freq: Three times a day (TID) | ORAL | Status: AC | PRN

## 2011-01-05 MED ORDER — METHYLPREDNISOLONE ACETATE 80 MG/ML IJ SUSP
80.0000 mg | Freq: Once | INTRAMUSCULAR | Status: AC
  Administered 2011-01-05: 80 mg via INTRAMUSCULAR

## 2011-01-05 MED ORDER — GLIPIZIDE 5 MG PO TABS: 5.0000 mg | ORAL_TABLET | Freq: Two times a day (BID) | ORAL | Status: DC

## 2011-01-05 NOTE — Patient Instructions (Signed)
F/u early December, cancel 11/29 appt pls.  INjections today for back pain and med to be started tomorrow is being sent in.  LABWORK  NEEDS TO BE DONE BETWEEN 3 TO 7 DAYS BEFORE YOUR NEXT SCEDULED  VISIT.  THIS WILL IMPROVE THE QUALITY OF YOUR CARE.

## 2011-01-06 ENCOUNTER — Other Ambulatory Visit: Payer: Self-pay | Admitting: Family Medicine

## 2011-01-06 DIAGNOSIS — Z139 Encounter for screening, unspecified: Secondary | ICD-10-CM

## 2011-01-06 NOTE — Assessment & Plan Note (Signed)
Uncontrolled, toradol and depo medrol administered, and ibuprofen and prednisone dose pack prescribed

## 2011-01-06 NOTE — Progress Notes (Signed)
  Subjective:    Patient ID: Tammy Hurley, female    DOB: 11-Sep-1945, 65 y.o.   MRN: [id number]  HPI 1 week h/o low back pain radiating round right lower abdomen , buttock and groin to lateral righ thigh , pt has established disc disease, had epidural injection in June , from which she benefitted, had contacted her pain specialist , who advised she would have to check her ins before seeing her. She can identify no specific aggravating factor which brought on the pain. Denies incontinence of stool or urine, denies lower extremity weakness or numbness. Reports blood sugars are still averaging about 150 in the mornings, when the pharmacy was directly contacted, pt has not been taking the increased dose of glipizide she was recently directed to do.   Review of Systems See HPI Denies recent fever or chills. Denies sinus pressure, nasal congestion, ear pain or sore throat. Denies chest congestion, productive cough or wheezing. Denies chest pains, palpitations and leg swelling Denies abdominal pain, nausea, vomiting,diarrhea or constipation.   Denies dysuria, frequency, hesitancy or incontinence.  Denies headaches, seizures, numbness, or tingling. Denies depression, anxiety or insomnia. Denies skin break down or rash.        Objective:   Physical Exam  Patient alert and oriented and in no cardiopulmonary distress.  HEENT: No facial asymmetry, EOMI, no sinus tenderness,  oropharynx pink and moist.  Neck supple no adenopathy.  Chest: Clear to auscultation bilaterally.  CVS: S1, S2 no murmurs, no S3.  ABD: Soft non tender. Bowel sounds normal.  Ext: No edema  [id number]  ROM spine,adequate in  shoulders, hips and knees.  Skin: Intact, no ulcerations or rash noted.  Psych: Good eye contact, normal affect. Memory intact not anxious or depressed appearing.  CNS: CN 2-12 intact, power, tone and sensation normal throughout.       Assessment & Plan:

## 2011-01-06 NOTE — Assessment & Plan Note (Signed)
Deteriorated and uncontrolled. Again requested that pt attend class on nutrition, also dose increase in glipizide re-inforced

## 2011-01-06 NOTE — Assessment & Plan Note (Signed)
Controlled, no change in medication  

## 2011-01-28 LAB — I-STAT 8, (EC8 V) (CONVERTED LAB)
Acid-Base Excess: 2
Chloride: 105
HCT: 40
Operator id: 216461
Potassium: 3.8
TCO2: 28
pCO2, Ven: 42 — ABNORMAL LOW
pH, Ven: 7.409 — ABNORMAL HIGH

## 2011-01-28 LAB — URINALYSIS, ROUTINE W REFLEX MICROSCOPIC
Bilirubin Urine: NEGATIVE
Specific Gravity, Urine: 1.025
pH: 5.5

## 2011-01-28 LAB — URINE MICROSCOPIC-ADD ON

## 2011-01-28 LAB — POCT I-STAT CREATININE: Operator id: 216461

## 2011-02-14 ENCOUNTER — Ambulatory Visit (HOSPITAL_COMMUNITY)
Admission: RE | Admit: 2011-02-14 | Discharge: 2011-02-14 | Disposition: A | Discharge status: Home / Self Care | Payer: Medicare Other | Source: Ambulatory Visit | Attending: Family Medicine | Admitting: Family Medicine

## 2011-02-14 DIAGNOSIS — Z1231 Encounter for screening mammogram for malignant neoplasm of breast: Secondary | ICD-10-CM | POA: Insufficient documentation

## 2011-02-14 DIAGNOSIS — Z139 Encounter for screening, unspecified: Secondary | ICD-10-CM

## 2011-03-24 ENCOUNTER — Ambulatory Visit: Payer: Medicare Other | Admitting: Family Medicine

## 2011-03-29 ENCOUNTER — Encounter: Payer: Self-pay | Admitting: Family Medicine

## 2011-03-30 ENCOUNTER — Ambulatory Visit (INDEPENDENT_AMBULATORY_CARE_PROVIDER_SITE_OTHER): Payer: Medicare Other | Admitting: Family Medicine

## 2011-03-30 ENCOUNTER — Encounter: Payer: Self-pay | Admitting: Family Medicine

## 2011-03-30 VITALS — BP 114/72 | HR 71 | Resp 18 | Ht 67.0 in | Wt 256.1 lb

## 2011-03-30 DIAGNOSIS — M549 Dorsalgia, unspecified: Secondary | ICD-10-CM

## 2011-03-30 DIAGNOSIS — I10 Essential (primary) hypertension: Secondary | ICD-10-CM

## 2011-03-30 DIAGNOSIS — Z23 Encounter for immunization: Secondary | ICD-10-CM

## 2011-03-30 DIAGNOSIS — E119 Type 2 diabetes mellitus without complications: Secondary | ICD-10-CM

## 2011-03-30 DIAGNOSIS — E669 Obesity, unspecified: Secondary | ICD-10-CM

## 2011-03-30 DIAGNOSIS — E785 Hyperlipidemia, unspecified: Secondary | ICD-10-CM

## 2011-03-30 DIAGNOSIS — M25559 Pain in unspecified hip: Secondary | ICD-10-CM

## 2011-03-30 MED ORDER — DICLOFENAC SODIUM 75 MG PO TBEC: 75.0000 mg | DELAYED_RELEASE_TABLET | Freq: Two times a day (BID) | ORAL | Status: DC

## 2011-03-30 NOTE — Progress Notes (Signed)
  Subjective:    Patient ID: Tammy Hurley, female    DOB: 1945-11-19, 65 y.o.   MRN: [id number]  HPI The PT is here for follow up and re-evaluation of chronic medical conditions, medication management and review of any available recent lab and radiology data.  Preventive health is updated, specifically  Cancer screening and Immunization.   Questions or concerns regarding consultations or procedures which the PT has had in the interim are  addressed. The PT denies any adverse reactions to current medications since the last visit.  C/o uncontrolled back pain, and wants to try diclofenac as an anti inflammatory at the recommendation of a friend. Has been more diligent with diet , with resultant weight loss and improved blood sugars    Review of Systems    See HPI Denies recent fever or chills. Denies sinus pressure, nasal congestion, ear pain or sore throat. Denies chest congestion, productive cough or wheezing. Denies chest pains, palpitations and leg swelling Denies abdominal pain, nausea, vomiting,diarrhea or constipation.   Denies dysuria, frequency, hesitancy or incontinence.  Denies headaches, seizures, numbness, or tingling. Denies depression, anxiety or insomnia. Denies skin break down or rash.     Objective:   Physical Exam  Patient alert and oriented and in no cardiopulmonary distress.  HEENT: No facial asymmetry, EOMI, no sinus tenderness,  oropharynx pink and moist.  Neck supple no adenopathy.  Chest: Clear to auscultation bilaterally.  CVS: S1, S2 no murmurs, no S3.  ABD: Soft non tender. Bowel sounds normal.  Ext: No edema  MS: Decreased ROM spine,adequate in  shoulders, and knees.Decreased in hips  Skin: Intact, no ulcerations or rash noted.  Psych: Good eye contact, normal affect. Memory intact not anxious or depressed appearing.  CNS: CN 2-12 intact, power, tone and sensation normal throughout.  Diabetic Foot Check:  Appearance - no lesions,  ulcers or calluses Skin - no unusual pallor or redness Sensation - grossly intact to light touch Monofilament testing -  Right - Great toe, medial, central, lateral ball and posterior foot decreased  Left - Great toe, medial, central, lateral ball and posterior foot decreased Pulses Left - Dorsalis Pedis and Posterior Tibia normal Right - Dorsalis Pedis and Posterior Tibia normal      Assessment & Plan:

## 2011-03-30 NOTE — Patient Instructions (Addendum)
F/u in 4 months.  Congrats on weight loss.  HBA1C cmp and egfr and lipids today.  Hba1c and chem 7 in 4 months , before next visit.  Please schedule your eye exam    A healthy diet is rich in fruit, vegetables and whole grains. Poultry fish, nuts and beans are a healthy choice for protein rather then red meat. A low sodium diet and drinking 64 ounces of water daily is generally recommended. Oils and sweet should be limited. Carbohydrates especially for those who are diabetic or overweight, should be limited to 30-45 gram per meal. It is important to eat on a regular schedule, at least 3 times daily. Snacks should be primarily fruits, vegetables or nuts.  BP is excellent Flu vaccine today.  New medication for hip pain as discussed

## 2011-03-31 LAB — COMPLETE METABOLIC PANEL WITH GFR
ALT: 30 U/L (ref 0–35)
CO2: 23 mEq/L (ref 19–32)
Calcium: 9.8 mg/dL (ref 8.4–10.5)
Chloride: 104 mEq/L (ref 96–112)
Creat: 1.56 mg/dL — ABNORMAL HIGH (ref 0.50–1.10)
GFR, Est African American: 39 mL/min — ABNORMAL LOW
GFR, Est Non African American: 34 mL/min — ABNORMAL LOW
Glucose, Bld: 145 mg/dL — ABNORMAL HIGH (ref 70–99)

## 2011-03-31 LAB — HEMOGLOBIN A1C: Mean Plasma Glucose: 154 mg/dL — ABNORMAL HIGH (ref <117)

## 2011-03-31 LAB — LIPID PANEL
LDL Cholesterol: 96 mg/dL (ref 0–99)
Triglycerides: 235 mg/dL — ABNORMAL HIGH (ref <150)

## 2011-04-01 ENCOUNTER — Other Ambulatory Visit: Payer: Self-pay

## 2011-04-01 DIAGNOSIS — E876 Hypokalemia: Secondary | ICD-10-CM

## 2011-04-03 NOTE — Assessment & Plan Note (Signed)
Uncontrolled, wants to try diclofenac, no interest in  any epidurals at this time

## 2011-04-03 NOTE — Assessment & Plan Note (Signed)
Controlled, no change in medication  

## 2011-04-03 NOTE — Assessment & Plan Note (Signed)
Improved, but still uncontrolled, pt applauded on improved level, and encouraged to continue to be diligent with diet

## 2011-04-03 NOTE — Assessment & Plan Note (Signed)
Improved. Pt applauded on succesful weight loss through lifestyle change, and encouraged to continue same. Weight loss goal set for the next several months.  

## 2011-04-04 LAB — COMPREHENSIVE METABOLIC PANEL
ALT: 38 U/L — ABNORMAL HIGH (ref 0–35)
CO2: 24 mEq/L (ref 19–32)
Calcium: 9.8 mg/dL (ref 8.4–10.5)
Chloride: 103 mEq/L (ref 96–112)
Creat: 1.41 mg/dL — ABNORMAL HIGH (ref 0.50–1.10)
Sodium: 139 mEq/L (ref 135–145)
Total Protein: 6.8 g/dL (ref 6.0–8.3)

## 2011-04-15 ENCOUNTER — Ambulatory Visit: Payer: Medicare Other | Admitting: Family Medicine

## 2011-05-02 ENCOUNTER — Ambulatory Visit: Payer: Medicare Other | Admitting: Family Medicine

## 2011-05-17 ENCOUNTER — Other Ambulatory Visit: Payer: Self-pay | Admitting: Family Medicine

## 2011-06-10 ENCOUNTER — Encounter: Payer: Self-pay | Admitting: Family Medicine

## 2011-06-10 ENCOUNTER — Ambulatory Visit (INDEPENDENT_AMBULATORY_CARE_PROVIDER_SITE_OTHER): Payer: Medicare Other | Admitting: Family Medicine

## 2011-06-10 VITALS — BP 128/70 | HR 88 | Resp 16 | Ht 67.0 in | Wt 258.1 lb

## 2011-06-10 DIAGNOSIS — M1990 Unspecified osteoarthritis, unspecified site: Secondary | ICD-10-CM

## 2011-06-10 DIAGNOSIS — M25569 Pain in unspecified knee: Secondary | ICD-10-CM

## 2011-06-10 DIAGNOSIS — E119 Type 2 diabetes mellitus without complications: Secondary | ICD-10-CM

## 2011-06-10 DIAGNOSIS — I10 Essential (primary) hypertension: Secondary | ICD-10-CM

## 2011-06-10 MED ORDER — LANSOPRAZOLE 30 MG PO CPDR: 30.0000 mg | DELAYED_RELEASE_CAPSULE | Freq: Every day | ORAL | Status: DC

## 2011-06-10 MED ORDER — GLIPIZIDE 5 MG PO TABS: 5.0000 mg | ORAL_TABLET | Freq: Two times a day (BID) | ORAL | Status: DC

## 2011-06-10 MED ORDER — METOPROLOL SUCCINATE ER 200 MG PO TB24: 200.0000 mg | ORAL_TABLET | Freq: Every day | ORAL | Status: DC

## 2011-06-10 MED ORDER — DICLOFENAC SODIUM 1 % TD GEL: TRANSDERMAL | Status: DC

## 2011-06-10 NOTE — Patient Instructions (Signed)
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

## 2011-06-11 DIAGNOSIS — M25569 Pain in unspecified knee: Secondary | ICD-10-CM | POA: Insufficient documentation

## 2011-06-11 NOTE — Progress Notes (Signed)
  Subjective:    Patient ID: Tammy Hurley, female    DOB: July 30, 1945, 66 y.o.   MRN: [id number]  HPI Pt in with a 3 day h/o left knee pain and stiffness, state much better on the da of the visit, after she change the shoes she has been weraing to one which is more comfortable. She has worked on lifestyle change with no success in weight loss, reports blood sugar fasting is seldom over 125   Review of Systems See HPI Denies recent fever or chills. Denies sinus pressure, nasal congestion, ear pain or sore throat. Denies chest congestion, productive cough or wheezing. Denies chest pains, palpitations and leg swelling 2 recent episodes of abdominal pain and nausea, in retrospec associated with high caloric sweet food. I explained that metformni will act like this if the diet is inappropriate.   Denies dysuria, frequency, hesitancy or incontinence.  Denies headaches, seizures, numbness, or tingling. Denies depression, anxiety or insomnia. Denies skin break down or rash.        Objective:   Physical Exam Patient alert and oriented and in no cardiopulmonary distress.  HEENT: No facial asymmetry, EOMI, no sinus tenderness,  oropharynx pink and moist.  Neck supple no adenopathy.  Chest: Clear to auscultation bilaterally.  CVS: S1, S2 no murmurs, no S3.  ABD: Soft non tender. Bowel sounds normal.  Ext: No edema  MS: Adequate ROM spine, shoulders, hips and reduced in  Knees.Medial aspect of left knee tender and knee slightly swollen  Skin: Intact, no ulcerations or rash noted.  Psych: Good eye contact, normal affect. Memory intact not anxious or depressed appearing.  CNS: CN 2-12 intact, power, tone and sensation normal throughout.        Assessment & Plan:

## 2011-06-11 NOTE — Assessment & Plan Note (Signed)
Controlled, no change in medication  

## 2011-06-11 NOTE — Assessment & Plan Note (Signed)
Acute flare of knee pain x 3 days which is improving , advised use of topical anti inflammatories for acute symptoms and correct foot wear

## 2011-06-28 ENCOUNTER — Ambulatory Visit (INDEPENDENT_AMBULATORY_CARE_PROVIDER_SITE_OTHER): Payer: Medicare Other | Admitting: Family Medicine

## 2011-06-28 ENCOUNTER — Encounter: Payer: Self-pay | Admitting: Family Medicine

## 2011-06-28 VITALS — BP 128/76 | HR 68 | Temp 97.8°F | Resp 20 | Ht 67.0 in | Wt 258.0 lb

## 2011-06-28 DIAGNOSIS — J209 Acute bronchitis, unspecified: Secondary | ICD-10-CM

## 2011-06-28 DIAGNOSIS — R197 Diarrhea, unspecified: Secondary | ICD-10-CM | POA: Insufficient documentation

## 2011-06-28 DIAGNOSIS — E119 Type 2 diabetes mellitus without complications: Secondary | ICD-10-CM

## 2011-06-28 DIAGNOSIS — M549 Dorsalgia, unspecified: Secondary | ICD-10-CM

## 2011-06-28 DIAGNOSIS — I10 Essential (primary) hypertension: Secondary | ICD-10-CM

## 2011-06-28 MED ORDER — PENICILLIN V POTASSIUM 500 MG PO TABS: 500.0000 mg | ORAL_TABLET | Freq: Three times a day (TID) | ORAL | Status: AC

## 2011-06-28 MED ORDER — CEFTRIAXONE SODIUM 1 G IJ SOLR
500.0000 mg | Freq: Once | INTRAMUSCULAR | Status: AC
  Administered 2011-06-28: 500 mg via INTRAMUSCULAR

## 2011-06-28 MED ORDER — BENZONATATE 100 MG PO CAPS: 100.0000 mg | ORAL_CAPSULE | Freq: Four times a day (QID) | ORAL | Status: DC | PRN

## 2011-06-28 NOTE — Assessment & Plan Note (Signed)
3 day h/o acute onset, slightly improved

## 2011-06-28 NOTE — Assessment & Plan Note (Signed)
Controlled, no change in medication  

## 2011-06-28 NOTE — Patient Instructions (Signed)
F/u as before.  You have acute bronchitis. You will get Rocephin in the office and medication is also sent to your pharmacy.  Eat a bland diet as tolerated , till your diarrhea improves, we will give you the BRAT diet.  It is important you do not become dehydrated.  Call if you do not feel much better in 3 days, and please take entire course of medication

## 2011-07-06 ENCOUNTER — Telehealth: Payer: Self-pay | Admitting: Family Medicine

## 2011-07-06 MED ORDER — FLUCONAZOLE 150 MG PO TABS: ORAL_TABLET | ORAL | Status: AC

## 2011-07-06 NOTE — Telephone Encounter (Signed)
pls advise med has been sent in 

## 2011-07-17 NOTE — Assessment & Plan Note (Signed)
Controlled, no med changed

## 2011-07-17 NOTE — Assessment & Plan Note (Signed)
Unchanged continue current management

## 2011-07-17 NOTE — Progress Notes (Signed)
  Subjective:    Patient ID: Tammy Hurley, female    DOB: 1946/01/17, 66 y.o.   MRN: [id number]  HPI 3 day h/o acute diarheah and vomiting with chills and body aches. C/o chest congestion with cough productive of brown sputum   Review of Systems    See HPI Denies recent fever . Denies sinus pressure, nasal congestion, ear pain or sore throat. Denies chest pains, palpitations and leg swelling  Denies dysuria, frequency, hesitancy or incontinence.  Denies headaches, seizures, numbness, or tingling. Denies depression, anxiety or insomnia. Denies skin break down or rash.     Objective:   Physical Exam Patient alert and oriented and in no cardiopulmonary distress.  HEENT: No facial asymmetry, EOMI, no sinus tenderness,  oropharynx pink and moist.  Neck supple no adenopathy.  Chest: decreased air entry with crackles and wheezes  CVS: S1, S2 no murmurs, no S3.  ABD: Soft , hyperactive bowel sounds, diffuse superficial tenderness  Ext: No edema  MS: decreased  ROM spine, shoulders, hips and knees.  Skin: Intact, no ulcerations or rash noted.  Psych: Good eye contact, normal affect. Memory intact not anxious or depressed appearing.  CNS: CN 2-12 intact, power, tone and sensation normal throughout.        Assessment & Plan:

## 2011-07-17 NOTE — Assessment & Plan Note (Signed)
Antibiotics and decongestants prescribed 

## 2011-08-02 ENCOUNTER — Ambulatory Visit: Payer: Medicare Other | Admitting: Family Medicine

## 2011-08-03 LAB — BASIC METABOLIC PANEL
BUN: 29 mg/dL — ABNORMAL HIGH (ref 6–23)
CO2: 17 mEq/L — ABNORMAL LOW (ref 19–32)
Chloride: 105 mEq/L (ref 96–112)
Glucose, Bld: 165 mg/dL — ABNORMAL HIGH (ref 70–99)
Potassium: 5.5 mEq/L — ABNORMAL HIGH (ref 3.5–5.3)

## 2011-08-03 LAB — HEMOGLOBIN A1C: Hgb A1c MFr Bld: 7.3 % — ABNORMAL HIGH (ref <5.7)

## 2011-08-08 ENCOUNTER — Ambulatory Visit (INDEPENDENT_AMBULATORY_CARE_PROVIDER_SITE_OTHER): Payer: Medicare Other | Admitting: Family Medicine

## 2011-08-08 ENCOUNTER — Encounter: Payer: Self-pay | Admitting: Family Medicine

## 2011-08-08 VITALS — BP 138/76 | HR 64 | Resp 18 | Ht 67.0 in | Wt 259.1 lb

## 2011-08-08 DIAGNOSIS — E1129 Type 2 diabetes mellitus with other diabetic kidney complication: Secondary | ICD-10-CM

## 2011-08-08 DIAGNOSIS — IMO0002 Reserved for concepts with insufficient information to code with codable children: Secondary | ICD-10-CM

## 2011-08-08 DIAGNOSIS — E669 Obesity, unspecified: Secondary | ICD-10-CM

## 2011-08-08 DIAGNOSIS — K219 Gastro-esophageal reflux disease without esophagitis: Secondary | ICD-10-CM

## 2011-08-08 DIAGNOSIS — N058 Unspecified nephritic syndrome with other morphologic changes: Secondary | ICD-10-CM

## 2011-08-08 DIAGNOSIS — E1121 Type 2 diabetes mellitus with diabetic nephropathy: Secondary | ICD-10-CM | POA: Insufficient documentation

## 2011-08-08 DIAGNOSIS — I10 Essential (primary) hypertension: Secondary | ICD-10-CM

## 2011-08-08 DIAGNOSIS — E1165 Type 2 diabetes mellitus with hyperglycemia: Secondary | ICD-10-CM

## 2011-08-08 DIAGNOSIS — M549 Dorsalgia, unspecified: Secondary | ICD-10-CM

## 2011-08-08 NOTE — Progress Notes (Signed)
  Subjective:    Patient ID: Tammy Hurley, female    DOB: 22-Jan-1946, 66 y.o.   MRN: [id number]  HPI    Review of Systems     Objective:   Physical Exam        Assessment & Plan:

## 2011-08-08 NOTE — Patient Instructions (Addendum)
f/u in 3 month  STOP metformin.  You are being referred to the endocrinologist of your choice.Also for an [place] of your kidneys   You need to call and go to a group session on diabetic eating, we will give you the information  We will give and explain 15 gm carb information with you.  It is important that you exercise regularly at least 30 minutes 5 times a week. If you develop chest pain, have severe difficulty breathing, or feel very tired, stop exercising immediately and seek medical attention   A healthy diet is rich in fruit, vegetables and whole grains. Poultry fish, nuts and beans are a healthy choice for protein rather then red meat. A low sodium diet and drinking 64 ounces of water daily is generally recommended. Oils and sweet should be limited. Carbohydrates especially for those who are diabetic or overweight, should be limited to 30-45 gram per meal. It is important to eat on a regular schedule, at least 3 times daily. Snacks should be primarily fruits, vegetables or nuts.  Repeat chem 7, ensure you have been drinking at least 64 ounces water daily.   You need a pneumonia vaccine, please reconsider

## 2011-08-10 ENCOUNTER — Ambulatory Visit (HOSPITAL_COMMUNITY): Payer: Medicare Other

## 2011-08-16 ENCOUNTER — Ambulatory Visit (HOSPITAL_COMMUNITY)
Admission: RE | Admit: 2011-08-16 | Discharge: 2011-08-16 | Disposition: A | Discharge status: Home / Self Care | Payer: Medicare Other | Source: Ambulatory Visit | Attending: Family Medicine | Admitting: Family Medicine

## 2011-08-16 DIAGNOSIS — Q619 Cystic kidney disease, unspecified: Secondary | ICD-10-CM | POA: Insufficient documentation

## 2011-08-16 DIAGNOSIS — N19 Unspecified kidney failure: Secondary | ICD-10-CM | POA: Insufficient documentation

## 2011-08-16 DIAGNOSIS — R1031 Right lower quadrant pain: Secondary | ICD-10-CM | POA: Insufficient documentation

## 2011-08-16 DIAGNOSIS — E1129 Type 2 diabetes mellitus with other diabetic kidney complication: Secondary | ICD-10-CM

## 2011-08-16 DIAGNOSIS — IMO0002 Reserved for concepts with insufficient information to code with codable children: Secondary | ICD-10-CM

## 2011-08-16 DIAGNOSIS — E1165 Type 2 diabetes mellitus with hyperglycemia: Secondary | ICD-10-CM | POA: Insufficient documentation

## 2011-08-22 ENCOUNTER — Other Ambulatory Visit: Payer: Self-pay

## 2011-08-22 ENCOUNTER — Telehealth: Payer: Self-pay | Admitting: Family Medicine

## 2011-08-22 MED ORDER — GLUCOSE BLOOD VI STRP: ORAL_STRIP | Status: AC

## 2011-08-22 NOTE — Telephone Encounter (Signed)
Test strips sent in and pt aware of [place] results.

## 2011-09-05 NOTE — Assessment & Plan Note (Signed)
Unchanged. Patient re-educated about  the importance of commitment to a  minimum of 150 minutes of exercise per week. The importance of healthy food choices with portion control discussed. Encouraged to start a food diary, count calories and to consider  joining a support group. Sample diet sheets offered. Goals set by the patient for the next several months.    

## 2011-09-05 NOTE — Assessment & Plan Note (Signed)
Chronic, has been to pain management, no current flare

## 2011-09-05 NOTE — Assessment & Plan Note (Signed)
Controlled, no change in medication  

## 2011-09-05 NOTE — Assessment & Plan Note (Signed)
Uncontrolled with renal damage. Pt to stop metformin and start insulin. She has been resistant to this for some time. Endocrinologist input is desirable due to clear contraindication t metformin, her inability o lose weight and control eating, deterioration in renal function and the fact that she is still uncontrolled. She is to call with the Doc she wishes to follow her, does not prefer local Doc though I explained the desirability of this from a management standpoint

## 2011-10-24 ENCOUNTER — Other Ambulatory Visit: Payer: Self-pay | Admitting: Family Medicine

## 2011-11-12 ENCOUNTER — Other Ambulatory Visit: Payer: Self-pay | Admitting: Family Medicine

## 2011-12-22 ENCOUNTER — Other Ambulatory Visit: Payer: Self-pay | Admitting: Family Medicine

## 2012-01-20 ENCOUNTER — Other Ambulatory Visit: Payer: Self-pay | Admitting: Family Medicine

## 2012-01-20 DIAGNOSIS — Z139 Encounter for screening, unspecified: Secondary | ICD-10-CM

## 2012-01-30 ENCOUNTER — Other Ambulatory Visit: Payer: Self-pay | Admitting: Family Medicine

## 2012-02-20 ENCOUNTER — Ambulatory Visit (HOSPITAL_COMMUNITY)
Admission: RE | Admit: 2012-02-20 | Discharge: 2012-02-20 | Disposition: A | Discharge status: Home / Self Care | Payer: Medicare Other | Source: Ambulatory Visit | Attending: Family Medicine | Admitting: Family Medicine

## 2012-02-20 DIAGNOSIS — Z1231 Encounter for screening mammogram for malignant neoplasm of breast: Secondary | ICD-10-CM | POA: Insufficient documentation

## 2012-02-20 DIAGNOSIS — Z139 Encounter for screening, unspecified: Secondary | ICD-10-CM

## 2012-02-23 ENCOUNTER — Encounter: Payer: Self-pay | Admitting: Family Medicine

## 2012-02-23 ENCOUNTER — Ambulatory Visit (INDEPENDENT_AMBULATORY_CARE_PROVIDER_SITE_OTHER): Payer: Medicare Other | Admitting: Family Medicine

## 2012-02-23 VITALS — BP 158/80 | HR 70 | Resp 18 | Ht 67.0 in | Wt 279.0 lb

## 2012-02-23 DIAGNOSIS — E669 Obesity, unspecified: Secondary | ICD-10-CM

## 2012-02-23 DIAGNOSIS — N058 Unspecified nephritic syndrome with other morphologic changes: Secondary | ICD-10-CM

## 2012-02-23 DIAGNOSIS — I10 Essential (primary) hypertension: Secondary | ICD-10-CM

## 2012-02-23 DIAGNOSIS — K219 Gastro-esophageal reflux disease without esophagitis: Secondary | ICD-10-CM

## 2012-02-23 DIAGNOSIS — E1129 Type 2 diabetes mellitus with other diabetic kidney complication: Secondary | ICD-10-CM

## 2012-02-23 DIAGNOSIS — Z23 Encounter for immunization: Secondary | ICD-10-CM

## 2012-02-23 DIAGNOSIS — E1165 Type 2 diabetes mellitus with hyperglycemia: Secondary | ICD-10-CM

## 2012-02-23 DIAGNOSIS — IMO0002 Reserved for concepts with insufficient information to code with codable children: Secondary | ICD-10-CM

## 2012-02-23 MED ORDER — METOPROLOL SUCCINATE ER 200 MG PO TB24: 200.0000 mg | ORAL_TABLET | Freq: Every day | ORAL | Status: DC

## 2012-02-23 NOTE — Patient Instructions (Addendum)
F/u early January  Blood pressure elevated, I recommend, weight loss, daily physical activity and additional medication cozaar 50mg  daily. I understand you want to hold off on the med which also has kidney protection value at this time  I suggest stop actos due to excessive weight gain since starting this. Increase glipizide to one and a half daily, then up to one twice daily as was prescribed, if you need this. I will send a note to your endocrinologist , and definitely want her to continue to be in charge of your blood sugar control, so please keep appt next month. Follow her recommendations please   Flu and pneumonia vaccines today.  Reconsider zostavax you need this  Weight loss goal of 1.5 pounds per week  Refills as requested

## 2012-02-25 NOTE — Assessment & Plan Note (Signed)
Uncontrolled, esp due to weight gain I believe. ARB/ACE  Indicated but pt refusing at this time

## 2012-02-25 NOTE — Assessment & Plan Note (Signed)
Controlled, no change in medication  

## 2012-02-25 NOTE — Assessment & Plan Note (Signed)
improved

## 2012-02-25 NOTE — Progress Notes (Signed)
  Subjective:    Patient ID: Tammy Hurley, female    DOB: 04/04/1946, 66 y.o.   MRN: [id number]  HPI The PT is here for follow up and re-evaluation of chronic medical conditions, medication management and review of any available recent lab and radiology data.  Preventive health is updated, specifically  Cancer screening and Immunization.  Refusing zostavax Questions or concerns regarding consultations or procedures which the PT has had in the interim are  Addressed.Now seeing endo who she is very happy with, reports vast improvement in her blood sugars , but is horrified with her 30 pound weight gain. Plans to start physical activity, calorie counting and interested in stopping the actos which I believe is in part responsible The PT denies any adverse reactions to current medications since the last visit.        Review of Systems See HPI Denies recent fever or chills. Denies sinus pressure, nasal congestion, ear pain or sore throat. Denies chest congestion, productive cough or wheezing. Denies chest pains, palpitations and leg swelling Denies abdominal pain, nausea, vomiting,diarrhea or constipation.   Denies dysuria, frequency, hesitancy or incontinence. Chronic back pain improved, denies swelling and limitation in mobility. Denies headaches, seizures, numbness, or tingling. Denies depression, anxiety or insomnia. Denies skin break down or rash.        Objective:   Physical Exam Patient alert and oriented and in no cardiopulmonary distress.  HEENT: No facial asymmetry, EOMI, no sinus tenderness,  oropharynx pink and moist.  Neck supple no adenopathy.  Chest: Clear to auscultation bilaterally.  CVS: S1, S2 no murmurs, no S3.  ABD: Soft non tender. Bowel sounds normal.  Ext: No edema  MS: Adequate ROM spine, shoulders, hips and knees.  Skin: Intact, no ulcerations or rash noted.  Psych: Good eye contact, normal affect. Memory intact mildly  anxious not  depressed  appearing.  CNS: CN 2-12 intact, power,  normal throughout.        Assessment & Plan:

## 2012-02-25 NOTE — Assessment & Plan Note (Signed)
Reports improved control, on actos, however, has gained 30 pounds.I suggest dose increase on glipizide, which she was not taking as prescribed, d/c actos, start carb counting and commit to daily physical activity. Continue to follow with endo

## 2012-02-25 NOTE — Assessment & Plan Note (Signed)
Deteriorated. Patient re-educated about  the importance of commitment to a  minimum of 150 minutes of exercise per week. The importance of healthy food choices with portion control discussed. Encouraged to start a food diary, count calories and to consider  joining a support group. Sample diet sheets offered. Goals set by the patient for the next several months.    

## 2012-03-16 ENCOUNTER — Other Ambulatory Visit: Payer: Self-pay | Admitting: Family Medicine

## 2012-05-02 ENCOUNTER — Ambulatory Visit (INDEPENDENT_AMBULATORY_CARE_PROVIDER_SITE_OTHER): Payer: Medicare Other | Admitting: Family Medicine

## 2012-05-02 ENCOUNTER — Encounter: Payer: Self-pay | Admitting: Family Medicine

## 2012-05-02 VITALS — BP 132/76 | HR 90 | Resp 18 | Ht 67.0 in | Wt 265.0 lb

## 2012-05-02 DIAGNOSIS — E669 Obesity, unspecified: Secondary | ICD-10-CM

## 2012-05-02 DIAGNOSIS — R5383 Other fatigue: Secondary | ICD-10-CM

## 2012-05-02 DIAGNOSIS — Z1211 Encounter for screening for malignant neoplasm of colon: Secondary | ICD-10-CM

## 2012-05-02 DIAGNOSIS — M818 Other osteoporosis without current pathological fracture: Secondary | ICD-10-CM

## 2012-05-02 DIAGNOSIS — E1165 Type 2 diabetes mellitus with hyperglycemia: Secondary | ICD-10-CM

## 2012-05-02 DIAGNOSIS — G4733 Obstructive sleep apnea (adult) (pediatric): Secondary | ICD-10-CM

## 2012-05-02 DIAGNOSIS — M79609 Pain in unspecified limb: Secondary | ICD-10-CM

## 2012-05-02 DIAGNOSIS — I10 Essential (primary) hypertension: Secondary | ICD-10-CM

## 2012-05-02 DIAGNOSIS — E1129 Type 2 diabetes mellitus with other diabetic kidney complication: Secondary | ICD-10-CM

## 2012-05-02 DIAGNOSIS — N058 Unspecified nephritic syndrome with other morphologic changes: Secondary | ICD-10-CM

## 2012-05-02 DIAGNOSIS — R5381 Other malaise: Secondary | ICD-10-CM

## 2012-05-02 DIAGNOSIS — IMO0002 Reserved for concepts with insufficient information to code with codable children: Secondary | ICD-10-CM

## 2012-05-02 NOTE — Patient Instructions (Addendum)
Annual wellness in 4.5 month  Blood pressure is excellent.  Congrats on weight loss and excellent blood sugars.  Please get fasting labs in the next 7 days, CBC, lipid, cmp and EGFR, Tsh and vit D. A copy of your labs will be sent to your endocrinologist,Dr Hester.  You are referred to Dr Karilyn Cota for screening colonoscopy.  You are referred to Dr. Maple Hudson for evaluation of possible sleep apnea  You appear to have carpal tunnel syndrome affecting the left hand , use of a brace at bedtime will reduce the pain which wakens you

## 2012-05-02 NOTE — Progress Notes (Signed)
  Subjective:    Patient ID: Tammy Hurley, female    DOB: [birth year], 67 y.o.   MRN: [id number]  HPI The PT is here for follow up and re-evaluation of chronic medical conditions, medication management and review of any available recent lab and radiology data.  Preventive health is updated, specifically  Cancer screening and Immunization.  Needs colonoscopy Still being followed by endo, and doing extremely well with blood sugar control and weight loss. Still needs to start regular exerciseThe PT denies any adverse reactions to current medications since the last visit.  C/o left hand numbness and tingling which awakens her at night at times and she has to Shake the hand, going on for months but worsening. C/o fatigue, and reports she is told that she stores excessively and likely has sleep apnea by her daughter who is being treated for thsi      Review of Systems ,See HPI Denies recent fever or chills. Denies sinus pressure, nasal congestion, ear pain or sore throat. Denies chest congestion, productive cough or wheezing. Denies chest pains, palpitations and leg swelling Denies abdominal pain, nausea, vomiting,diarrhea or constipation.   Denies dysuria, frequency, hesitancy or incontinence. Chronic  joint pain, and limitation in mobility. Denies headaches, seizures,  Denies depression, anxiety or insomnia. Denies skin break down or rash.        Objective:   Physical Exam  Patient alert and oriented and in no cardiopulmonary distress.  HEENT: No facial asymmetry, EOMI, no sinus tenderness,  oropharynx pink and moist.  Neck supple no adenopathy.  Chest: Clear to auscultation bilaterally.  CVS: S1, S2 no murmurs, no S3.  ABD: Soft non tender. Bowel sounds normal.  Ext: No edema  MS: Adequate though reduced ROM lumbar  spine,  hips and knees. Wasting of left thenar eminence, positive tinel's sign  Skin: Intact, no ulcerations or rash noted.  Psych: Good eye contact,  normal affect. Memory intact not anxious or depressed appearing.  CNS: CN 2-12 intact, power, tone and sensation normal throughout.       Assessment & Plan:

## 2012-05-09 ENCOUNTER — Encounter (INDEPENDENT_AMBULATORY_CARE_PROVIDER_SITE_OTHER): Payer: Self-pay | Admitting: *Deleted

## 2012-05-10 ENCOUNTER — Other Ambulatory Visit: Payer: Self-pay | Admitting: Family Medicine

## 2012-05-29 ENCOUNTER — Ambulatory Visit (INDEPENDENT_AMBULATORY_CARE_PROVIDER_SITE_OTHER): Payer: Medicare Other | Admitting: Internal Medicine

## 2012-05-29 ENCOUNTER — Encounter: Payer: Self-pay | Admitting: Internal Medicine

## 2012-05-29 VITALS — BP 124/72 | HR 88 | Ht 68.0 in | Wt 266.8 lb

## 2012-05-29 DIAGNOSIS — G4733 Obstructive sleep apnea (adult) (pediatric): Secondary | ICD-10-CM

## 2012-05-29 NOTE — Progress Notes (Signed)
05/29/12  67 yo F seen at kind request of Dr Lodema Hong for sleep medicine evaluation- sleep study in 2007 at [facility]. She never got the report. Daughter visits, tells her she snores. Stays tired. Naps do help but then can't sleep at night. Takes Tylenol PM. Bedtime 10 PM to 2 AM, sleep latency 40-60 minutes, waking 2 or 3 times before up anywhere between 5 AM and 9 AM. She is treated for high blood pressure, history of angina and diabetes. Denies lung disease, thyroid disease or an ENT surgery. Daughter has sleep apnea/CPAP.  Prior to Admission medications   Medication Sig Start Date End Date Taking? Authorizing Provider  aspirin (ADULT ASPIRIN EC LOW STRENGTH) 81 MG EC tablet Take 81 mg by mouth daily.     Yes Historical Provider, MD  Cholecalciferol (VITAMIN D3) 1000 UNIT capsule Two tabs by mouth once daily   Yes Historical Provider, MD  diclofenac (VOLTAREN) 75 MG EC tablet TAKE ONE TABLET BY MOUTH TWICE A DAY 12/22/11  Yes Kerri Perches, MD  Garlic 1000 MG CAPS Take by mouth daily. Take one capsule by mouth once daily    Yes Historical Provider, MD  glipiZIDE (GLUCOTROL) 5 MG tablet Take 5 mg by mouth daily. 06/10/11 06/09/12 Yes Kerri Perches, MD  glucose blood (FREESTYLE LITE) test strip Test as ordered. 08/22/11 08/21/12 Yes Kerri Perches, MD  glucose blood (ONE TOUCH TEST STRIPS) test strip Use as instructed 12/08/10  Yes Kerri Perches, MD  KLOR-CON M20 20 MEQ tablet TAKE ONE TABLET BY MOUTH TWICE A DAY 03/16/12  Yes Kerri Perches, MD  lansoprazole (PREVACID) 30 MG capsule Take 15 mg by mouth daily. One tab by mouth once daily 06/10/11  Yes Kerri Perches, MD  Liraglutide [facility]) Inject into the skin.   Yes Historical Provider, MD  Magnesium-Calcium-Folic Acid (MAGNEBIND 400) 960-454-0 MG TABS Take 1 tablet by mouth 2 (two) times daily. 12/22/10  Yes Kerri Perches, MD  metoprolol (TOPROL-XL) 200 MG 24 hr tablet Take 1 tablet (200 mg total) by mouth  daily. 02/23/12  Yes Kerri Perches, MD  [facility] DELICA LANCETS MISC by Does not apply route.     Yes Historical Provider, MD  Probiotic Product (PROBIOTIC DAILY PO) Take 1 capsule by mouth daily.   Yes Historical Provider, MD  triamterene-hydrochlorothiazide (MAXZIDE) 75-50 MG per tablet TAKE ONE (1) TABLET BY MOUTH EVERY      DAY 01/30/12  Yes Kerri Perches, MD  diclofenac sodium (VOLTAREN) 1 % GEL Apply twice daily to affected joint , as needed for pain 06/10/11   Kerri Perches, MD   Past Medical History  Diagnosis Date  . Supraventricular tachycardia   . Obesity   . Hypertension   . Diabetes mellitus, type 2   . Gastroesophageal reflux disease   . Back pain   . Headache   . Osteoarthritis    Past Surgical History  Procedure Laterality Date  . Appendectomy    . Cholecystectomy    . Vesicovaginal fistula closure w/ tah    . Left oophorectomy    . 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 Right eye prostheslis  03/07  . Retinal detachment repair w/ scleral buckle le    . Refiting of r prosthesis  03/09   Family History  Problem Relation Age of Onset  . Cancer Mother     Colon  . Alzheimer's disease Father   . Diabetes Brother  x3   History   Social History  . Marital Status: Divorced    Spouse Name: N/A    Number of Children: 3  . Years of Education: N/A   Occupational History  . unemployed    Social History Main Topics  . Smoking status: Former Smoker -- 0.30 packs/day for 3 years    Types: Cigarettes    Quit date: 04/25/1980  . Smokeless tobacco: Not on file  . Alcohol Use: Yes     Comment: Ocassionally  . Drug Use: No  . Sexually Active: Not on file   Other Topics Concern  . Not on file   Social History Narrative  . No narrative on file   ROS-see HPI Constitutional:   No-   weight loss, night sweats, fevers, chills, +fatigue, lassitude. HEENT:   No-  headaches, difficulty swallowing, tooth/dental problems, sore throat,       No-  sneezing,  itching, ear ache, nasal congestion, post nasal drip,  CV:  No-   chest pain, orthopnea, PND, swelling in lower extremities, anasarca,                                  dizziness, palpitations Resp: No-   shortness of breath with exertion or at rest.              No-   productive cough,  No non-productive cough,  No- coughing up of blood.              No-   change in color of mucus.  No- wheezing.   Skin: No-   rash or lesions. GI:  No-   heartburn, indigestion, abdominal pain, nausea, vomiting, diarrhea,                 change in bowel habits, loss of appetite GU: No-   dysuria, change in color of urine, no urgency or frequency.  No- flank pain. MS:  + Arthritis pain in knees.  . Neuro-     Arms and hand numb during sleep or when leaning on elbows. Psych:  No- change in mood or affect. No depression or anxiety.  No memory loss.  OBJ- Physical Exam BP 124/72  Pulse 88  Ht 5\' 8"  (1.727 m)  Wt 266 lb 12.8 oz (121.02 kg)  BMI 40.58 kg/m2  SpO2 97% General- Alert, Oriented, Affect-appropriate, Distress- none acute Skin- rash-none, lesions- none, excoriation- none Lymphadenopathy- none Head- atraumatic            Eyes- Gross vision intact, PERRLA, conjunctivae and secretions clear            Ears- Hearing, canals-normal            Nose- Clear, no-Septal dev, mucus, polyps, erosion, perforation             Throat- Mallampati III-IV , mucosa clear , drainage- none, tonsils- atrophic Neck- flexible , trachea midline, no stridor , thyroid nl, carotid no bruit Chest - symmetrical excursion , unlabored           Heart/CV- RRR , no murmur , no gallop  , no rub, nl s1 s2                           - JVD- none , edema- none, stasis changes- none, varices- none           Lung-  clear to P&A, wheeze- none, cough- none , dullness-none, rub- none           Chest wall-  Abd- tender-no, distended-no, bowel sounds-present, HSM- no Br/ Gen/ Rectal- Not done, not indicated Extrem- cyanosis- none, clubbing,  none, atrophy- none, strength- nl Neuro- grossly intact to observation

## 2012-05-29 NOTE — Patient Instructions (Addendum)
We will take a couple of days to try to locate the 2007 sleep study done at [facility]. If you haven't heard from [place] about it in that time, please call and we will schedule an new sleep study so we can take care of this issue for you.

## 2012-06-03 NOTE — Assessment & Plan Note (Signed)
Improved. Pt applauded on succesful weight loss through lifestyle change, and encouraged to continue same. Weight loss goal set for the next several months.  

## 2012-06-03 NOTE — Assessment & Plan Note (Signed)
Controlled, no change in medication DASH diet and commitment to daily physical activity for a minimum of 30 minutes discussed and encouraged, as a part of hypertension management. The importance of attaining a healthy weight is also discussed.  

## 2012-06-03 NOTE — Assessment & Plan Note (Addendum)
Hand pain and numbness , worse at night, likely carpal tunnel synd.left hand affected more than right Pt does not desire further workup currently, will use hand brace at night

## 2012-06-03 NOTE — Assessment & Plan Note (Signed)
Pt with h/o excessive snoring and chronic fatigue, will refer for formal evaluation for sleep apnea

## 2012-06-03 NOTE — Assessment & Plan Note (Signed)
Followed by endo and now excellent control. Patient advised to reduce carb and sweets, commit to regular physical activity, take meds as prescribed, test blood as directed, and attempt to lose weight, to improve blood sugar control.

## 2012-06-04 ENCOUNTER — Encounter (INDEPENDENT_AMBULATORY_CARE_PROVIDER_SITE_OTHER): Payer: Self-pay | Admitting: *Deleted

## 2012-06-07 NOTE — Assessment & Plan Note (Signed)
We agreed that we would give my staff to days to try to obtain 2007 sleep study report from [facility]. If not found then we will schedule a new study. We have begun education on the physiology of sleep apnea, medical concerns and treatment options.

## 2012-07-03 ENCOUNTER — Ambulatory Visit: Payer: Medicare Other | Admitting: Family Medicine

## 2012-07-10 ENCOUNTER — Ambulatory Visit: Payer: Medicare Other | Admitting: Internal Medicine

## 2012-07-11 ENCOUNTER — Telehealth (INDEPENDENT_AMBULATORY_CARE_PROVIDER_SITE_OTHER): Payer: Self-pay | Admitting: *Deleted

## 2012-07-11 ENCOUNTER — Encounter (INDEPENDENT_AMBULATORY_CARE_PROVIDER_SITE_OTHER): Payer: Self-pay | Admitting: *Deleted

## 2012-07-11 ENCOUNTER — Ambulatory Visit (INDEPENDENT_AMBULATORY_CARE_PROVIDER_SITE_OTHER): Payer: Medicare Other | Admitting: Family Medicine

## 2012-07-11 ENCOUNTER — Encounter: Payer: Self-pay | Admitting: Family Medicine

## 2012-07-11 ENCOUNTER — Other Ambulatory Visit (INDEPENDENT_AMBULATORY_CARE_PROVIDER_SITE_OTHER): Payer: Self-pay | Admitting: *Deleted

## 2012-07-11 VITALS — BP 142/78 | HR 82 | Resp 18 | Ht 67.0 in | Wt 262.1 lb

## 2012-07-11 DIAGNOSIS — E1121 Type 2 diabetes mellitus with diabetic nephropathy: Secondary | ICD-10-CM

## 2012-07-11 DIAGNOSIS — Z1211 Encounter for screening for malignant neoplasm of colon: Secondary | ICD-10-CM

## 2012-07-11 DIAGNOSIS — I10 Essential (primary) hypertension: Secondary | ICD-10-CM

## 2012-07-11 DIAGNOSIS — J209 Acute bronchitis, unspecified: Secondary | ICD-10-CM

## 2012-07-11 DIAGNOSIS — M1990 Unspecified osteoarthritis, unspecified site: Secondary | ICD-10-CM

## 2012-07-11 DIAGNOSIS — E1129 Type 2 diabetes mellitus with other diabetic kidney complication: Secondary | ICD-10-CM

## 2012-07-11 DIAGNOSIS — E785 Hyperlipidemia, unspecified: Secondary | ICD-10-CM

## 2012-07-11 DIAGNOSIS — N058 Unspecified nephritic syndrome with other morphologic changes: Secondary | ICD-10-CM

## 2012-07-11 MED ORDER — FLUCONAZOLE 150 MG PO TABS: ORAL_TABLET | ORAL | Status: AC

## 2012-07-11 MED ORDER — BENZONATATE 100 MG PO CAPS: 100.0000 mg | ORAL_CAPSULE | Freq: Four times a day (QID) | ORAL | Status: DC | PRN

## 2012-07-11 MED ORDER — HYDROCODONE-ACETAMINOPHEN 5-325 MG PO TABS: ORAL_TABLET | ORAL | Status: DC

## 2012-07-11 MED ORDER — DOXYCYCLINE HYCLATE 100 MG PO TABS: 100.0000 mg | ORAL_TABLET | Freq: Two times a day (BID) | ORAL | Status: DC

## 2012-07-11 MED ORDER — NAPROXEN 375 MG PO TABS: ORAL_TABLET | ORAL | Status: DC

## 2012-07-11 NOTE — Patient Instructions (Addendum)
F/u as before.  You are treated for acute bronchitis with doxycycline and decongestant tabs.  You will get fluconazole script in hand , for use only if needed, for vaginal itch associated with antibiotic use.  Instead of voltaren tablets, use naproxen , twice daily for the next 1 week, then as needed for arthritic flares. Restrict usage as much as possible as there are potential adverse side effects on kidney and hear from excessive use of any of these types of meds.  Hydrocodone is prescribed for very careful use for severe uncontrolled pain at bedtime.   Call if still symptomatic next Monday, or before if worsens, you will be sent for a CXR at that time. Currently I do not think you need one

## 2012-07-11 NOTE — Telephone Encounter (Signed)
Patient needs movi prep 

## 2012-07-11 NOTE — Progress Notes (Signed)
  Subjective:    Patient ID: Tammy Hurley, female    DOB: [birth year], 67 y.o.   MRN: [id number]  HPI 10 day h/o chest congestion, wheeze and cough, also pain with breathing in the chest as well as with cough. Sputum mainly clear, no fever, has had chills. Posisitve sick exposure. C/o uncontrolled and worsening low back and bilateral hip pain as well as left knee pain, difficulty with ambulation, no falls, took hydrocodone once wiith good result   Review of Systems See HPI  Denies sinus pressure, nasal congestion, ear pain or sore throat. Denies chest pains, palpitations and leg swelling Denies abdominal pain, nausea, vomiting,diarrhea or constipation.   Denies dysuria, frequency, hesitancy or incontinence.  Denies headaches, seizures, numbness, or tingling. Denies depression, anxiety or insomnia. Denies skin break down or rash.        Objective:   Physical Exam  Patient alert and oriented and in no cardiopulmonary distress.  HEENT: No facial asymmetry, EOMI, no sinus tenderness,  oropharynx pink and moist.  Neck supple no adenopathy.  Chest: adequate though decrease air entry, bilateral basilar crackles, no wheezes  CVS: S1, S2 no murmurs, no S3.  ABD: Soft non tender. Bowel sounds normal.  Ext: No edema  MS: decreased  ROM spine, and  hips adequate in shoulders and knees.  Skin: Intact, no ulcerations or rash noted.  Psych: Good eye contact, normal affect. Memory intact not anxious or depressed appearing.  CNS: CN 2-12 intact, power, tone and sensation normal throughout.       Assessment & Plan:

## 2012-07-12 LAB — LIPID PANEL
Cholesterol: 208 mg/dL — ABNORMAL HIGH (ref 0–200)
HDL: 40 mg/dL (ref >39)
LDL Cholesterol: 99 mg/dL (ref 0–99)
Triglycerides: 343 mg/dL — ABNORMAL HIGH (ref <150)
VLDL: 69 mg/dL — ABNORMAL HIGH (ref 0–40)

## 2012-07-12 LAB — CBC
HCT: 38.2 % (ref 36.0–46.0)
MCH: 28.8 pg (ref 26.0–34.0)
MCHC: 34.6 g/dL (ref 30.0–36.0)
RDW: 14.8 % (ref 11.5–15.5)

## 2012-07-12 LAB — COMPLETE METABOLIC PANEL WITH GFR
AST: 30 U/L (ref 0–37)
Albumin: 4.8 g/dL (ref 3.5–5.2)
Alkaline Phosphatase: 118 U/L — ABNORMAL HIGH (ref 39–117)
BUN: 26 mg/dL — ABNORMAL HIGH (ref 6–23)
GFR, Est Non African American: 29 mL/min — ABNORMAL LOW
Potassium: 4.8 mEq/L (ref 3.5–5.3)
Total Bilirubin: 0.4 mg/dL (ref 0.3–1.2)

## 2012-07-12 LAB — VITAMIN D 25 HYDROXY (VIT D DEFICIENCY, FRACTURES): Vit D, 25-Hydroxy: 51 ng/mL (ref 30–89)

## 2012-07-12 LAB — TSH: TSH: 1.875 u[IU]/mL (ref 0.350–4.500)

## 2012-07-12 MED ORDER — PEG-KCL-NACL-NASULF-NA ASC-C 100 G PO SOLR: 1.0000 | Freq: Once | ORAL | Status: DC

## 2012-07-15 DIAGNOSIS — E785 Hyperlipidemia, unspecified: Secondary | ICD-10-CM | POA: Insufficient documentation

## 2012-07-15 NOTE — Assessment & Plan Note (Signed)
Uncontrolled back pain limited hydrocodone for flarees

## 2012-07-15 NOTE — Assessment & Plan Note (Signed)
Controlled, no change in medication DASH diet and commitment to daily physical activity for a minimum of 30 minutes discussed and encouraged, as a part of hypertension management. The importance of attaining a healthy weight is also discussed.  

## 2012-07-15 NOTE — Assessment & Plan Note (Signed)
Marked elevation in TG and minimla in total cholesterol, pt is a candidate for statin therapy which she needs to strongly consider. Also needs to reduce fatty food intake esp cheese

## 2012-07-15 NOTE — Assessment & Plan Note (Signed)
Acutely symptomatic, decongestant and antibiotic prescribed

## 2012-07-15 NOTE — Assessment & Plan Note (Signed)
Followed by endo, deterioration in EGFR noted

## 2012-07-18 ENCOUNTER — Encounter (INDEPENDENT_AMBULATORY_CARE_PROVIDER_SITE_OTHER): Payer: Self-pay | Admitting: *Deleted

## 2012-07-27 ENCOUNTER — Other Ambulatory Visit: Payer: Self-pay | Admitting: Family Medicine

## 2012-08-01 ENCOUNTER — Telehealth (INDEPENDENT_AMBULATORY_CARE_PROVIDER_SITE_OTHER): Payer: Self-pay | Admitting: *Deleted

## 2012-08-01 NOTE — Telephone Encounter (Signed)
  Procedure: tcs  Reason/Indication:  screening  Has patient had this procedure before?  Yes, 2004 or 2005  If so, when, by whom and where?    Is there a family history of colon cancer?  no  Who?  What age when diagnosed?    Is patient diabetic?   yes      Does patient have prosthetic heart valve?  no  Do you have a pacemaker?  no  Has patient ever had endocarditis? no  Has patient had joint replacement within last 12 months?  no  Is patient on Coumadin, Plavix and/or Aspirin? yes  Medications: see EPIC  Allergies: biactrin  Medication Adjustment: asa 2 days, hold victoza evening before, glipizide morning of  Procedure date & time: 08/30/12 at 930

## 2012-08-02 NOTE — Telephone Encounter (Signed)
agree

## 2012-08-17 ENCOUNTER — Other Ambulatory Visit: Payer: Self-pay | Admitting: Family Medicine

## 2012-08-17 ENCOUNTER — Encounter (HOSPITAL_COMMUNITY): Payer: Self-pay | Admitting: Pharmacy Technician

## 2012-08-20 ENCOUNTER — Telehealth: Payer: Self-pay | Admitting: Family Medicine

## 2012-08-20 ENCOUNTER — Other Ambulatory Visit: Payer: Self-pay

## 2012-08-20 MED ORDER — TRIAMTERENE-HCTZ 75-50 MG PO TABS: ORAL_TABLET | ORAL | Status: DC

## 2012-08-20 NOTE — Telephone Encounter (Signed)
Pls send the most recent lab results to her endoDr Jewel-Hester , [address] , location [facility], [address], in [place]

## 2012-08-21 NOTE — Telephone Encounter (Signed)
Most recent labs faxed to Dr. Jackquline Bosch f [phone] p [phone]

## 2012-08-30 ENCOUNTER — Encounter (HOSPITAL_COMMUNITY): Payer: Self-pay | Admitting: *Deleted

## 2012-08-30 ENCOUNTER — Encounter (HOSPITAL_COMMUNITY)
Admission: RE | Disposition: A | Discharge status: Home / Self Care | Payer: Self-pay | Source: Ambulatory Visit | Attending: Internal Medicine

## 2012-08-30 ENCOUNTER — Ambulatory Visit (HOSPITAL_COMMUNITY)
Admission: RE | Admit: 2012-08-30 | Discharge: 2012-08-30 | Disposition: A | Discharge status: Home / Self Care | Payer: Medicare Other | Source: Ambulatory Visit | Attending: Internal Medicine | Admitting: Internal Medicine

## 2012-08-30 DIAGNOSIS — K5730 Diverticulosis of large intestine without perforation or abscess without bleeding: Secondary | ICD-10-CM

## 2012-08-30 DIAGNOSIS — I10 Essential (primary) hypertension: Secondary | ICD-10-CM | POA: Insufficient documentation

## 2012-08-30 DIAGNOSIS — D126 Benign neoplasm of colon, unspecified: Secondary | ICD-10-CM

## 2012-08-30 DIAGNOSIS — Z1211 Encounter for screening for malignant neoplasm of colon: Secondary | ICD-10-CM

## 2012-08-30 DIAGNOSIS — E119 Type 2 diabetes mellitus without complications: Secondary | ICD-10-CM | POA: Insufficient documentation

## 2012-08-30 DIAGNOSIS — Z01812 Encounter for preprocedural laboratory examination: Secondary | ICD-10-CM | POA: Insufficient documentation

## 2012-08-30 DIAGNOSIS — K644 Residual hemorrhoidal skin tags: Secondary | ICD-10-CM | POA: Insufficient documentation

## 2012-08-30 HISTORY — DX: Pure hypercholesterolemia, unspecified: E78.00

## 2012-08-30 HISTORY — PX: COLONOSCOPY: SHX5424

## 2012-08-30 LAB — GLUCOSE, CAPILLARY: Glucose-Capillary: 197 mg/dL — ABNORMAL HIGH (ref 70–99)

## 2012-08-30 SURGERY — COLONOSCOPY
Anesthesia: Moderate Sedation

## 2012-08-30 MED ORDER — SODIUM CHLORIDE 0.9 % IV SOLN
INTRAVENOUS | Status: DC
  Administered 2012-08-30: 09:00:00 via INTRAVENOUS

## 2012-08-30 MED ORDER — MEPERIDINE HCL 50 MG/ML IJ SOLN
INTRAMUSCULAR | Status: DC | PRN
  Administered 2012-08-30 (×2): 25 mg via INTRAVENOUS

## 2012-08-30 MED ORDER — STERILE WATER FOR IRRIGATION IR SOLN
Status: DC | PRN
  Administered 2012-08-30: 09:00:00

## 2012-08-30 MED ORDER — MIDAZOLAM HCL 5 MG/5ML IJ SOLN
INTRAMUSCULAR | Status: DC | PRN
  Administered 2012-08-30 (×3): 2 mg via INTRAVENOUS

## 2012-08-30 MED ORDER — MEPERIDINE HCL 50 MG/ML IJ SOLN
INTRAMUSCULAR | Status: AC
  Filled 2012-08-30: qty 1

## 2012-08-30 MED ORDER — MIDAZOLAM HCL 5 MG/5ML IJ SOLN
INTRAMUSCULAR | Status: AC
  Filled 2012-08-30: qty 10

## 2012-08-30 NOTE — Op Note (Signed)
COLONOSCOPY PROCEDURE REPORT  PATIENT:  Tammy Hurley  MR#:  [id number] Birthdate:  Nov 04, 1945, 67 y.o., female Endoscopist:  Dr. Malissa Hippo, MD Referred By:  Dr. Syliva Overman, MD Procedure Date: 08/30/2012  Procedure:   Colonoscopy  Indications:  Patient is 67 year old American female who is undergoing a potential risk screening colonoscopy. Patient's last exam was 10 years ago.  Informed Consent:  The procedure and risks were reviewed with the patient and informed consent was obtained.  Medications:  Demerol 50 mg IV Versed 6 mg IV  Description of procedure:  After a digital rectal exam was performed, that colonoscope was advanced from the anus through the rectum and colon to the area of the cecum, ileocecal valve and appendiceal orifice. The cecum was deeply intubated. These structures were well-seen and photographed for the record. From the level of the cecum and ileocecal valve, the scope was slowly and cautiously withdrawn. The mucosal surfaces were carefully surveyed utilizing scope tip to flexion to facilitate fold flattening as needed. The scope was pulled down into the rectum where a thorough exam including retroflexion was performed.  Findings:   Prep excellent. 4 mm polyp of the cold biopsy from hepatic flexure. Few small diverticula and sigmoid colon. Normal rectal mucosa. Small hemorrhoids below the dentate line.   Therapeutic/Diagnostic Maneuvers Performed:  See above  Complications:  None  Cecal Withdrawal Time:  10 minutes  Impression:  Examination performed to cecum. 4 mm polyp ablated via cold biopsied from hepatic flexure. Mild sigmoid colon diverticulosis. Small external hemorrhoids.  Recommendations:  Standard instructions given. I will contact patient with biopsy results and further recommendations.  [name]  08/30/2012 9:58 AM  CC: Dr. Syliva Overman, MD & Dr. Bonnetta Barry ref. provider found

## 2012-08-30 NOTE — H&P (Signed)
Tammy Hurley is an 67 y.o. female.   Chief Complaint: Patient is here for colonoscopy. HPI: Patient is 67 year old female who is undergoing screening colonoscopy. Her last exam was in January 2004. She denies abdominal pain change in her bowel habits or rectal bleeding. Mother was diagnosed with metastatic carcinoma at age 68. Primary could not be determined   Past Medical History  Diagnosis Date  . Supraventricular tachycardia   . Obesity   . Hypertension   . Diabetes mellitus, type 2   . Gastroesophageal reflux disease   . Back pain   . Osteoarthritis   . Hypercholesteremia     Past Surgical History  Procedure Laterality Date  . Appendectomy    . Cholecystectomy    . Vesicovaginal fistula closure w/ tah    . Left oophorectomy    . 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 Right eye prostheslis  03/07  . Retinal detachment repair w/ scleral buckle le    . Refiting of r prosthesis  03/09    Family History  Problem Relation Age of Onset  . Cancer Mother     Colon  . Alzheimer's disease Father   . Diabetes Brother     x3  . Colon cancer Neg Hx    Social History:  reports that she quit smoking about 32 years ago. Her smoking use included Cigarettes. She has a .9 pack-year smoking history. She does not have any smokeless tobacco history on file. She reports that  drinks alcohol. She reports that she does not use illicit drugs.  Allergies:  Allergies  Allergen Reactions  . Clarithromycin     Medications Prior to Admission  Medication Sig Dispense Refill  . aspirin (ADULT ASPIRIN EC LOW STRENGTH) 81 MG EC tablet Take 81 mg by mouth daily.        . Cholecalciferol (VITAMIN D3) 1000 UNIT capsule Two tabs by mouth once daily      . Garlic 1000 MG CAPS Take 3 capsules by mouth daily.       Marland Kitchen glipiZIDE (GLUCOTROL) 5 MG tablet TAKE ONE TABLET TWICE DAILY BEFORE A MEAL  60 tablet  4  . glucose blood (ONE TOUCH TEST STRIPS) test strip Use as instructed  100 each  2  .  HYDROcodone-acetaminophen (NORCO/VICODIN) 5-325 MG per tablet Take 1 tablet by mouth at bedtime as needed. One tablet at bedtime , as needed, for uncontrolled pain in knee, hips or back. Thirty tablets to last 4 month      . KLOR-CON M20 20 MEQ tablet TAKE ONE TABLET BY MOUTH TWICE A DAY  180 tablet  1  . lansoprazole (PREVACID) 30 MG capsule Take 15 mg by mouth daily. One tab by mouth once daily      . Liraglutide (VICTOZA [place]) Inject 1.2 mg into the skin.       . Magnesium-Calcium-Folic Acid (MAGNEBIND 400) 161-096-0 MG TABS Take 1 tablet by mouth 2 (two) times daily.  180 each  1  . metoprolol (TOPROL-XL) 200 MG 24 hr tablet Take 1 tablet (200 mg total) by mouth daily.  90 tablet  1  . naproxen (NAPROSYN) 375 MG tablet Take 375 mg by mouth 2 (two) times daily as needed (arthritis flare). One twice daily for 1 week, then as needed, for flare of arthritis. Discontinue voltaren . Sixty tablets to last 4 month      . ONETOUCH DELICA LANCETS MISC by Does not apply route.        Marland Kitchen  peg 3350 powder (MOVIPREP) 100 G SOLR Take 1 kit (100 g total) by mouth once.  1 kit  0  . Tetrahydrozoline HCl (EYE DROPS OP) Apply 1 drop to eye daily. Uses in right eye. Called [employer]-glide.      . triamterene-hydrochlorothiazide (MAXZIDE) 75-50 MG per tablet TAKE ONE (1) TABLET BY MOUTH EVERY DAY  90 tablet  1    Results for orders placed during the hospital encounter of 08/30/12 (from the past 48 hour(s))  GLUCOSE, CAPILLARY     Status: Abnormal   Collection Time    08/30/12  8:18 AM      Result Value Range   Glucose-Capillary 197 (*) 70 - 99 mg/dL   No results found.  ROS  Blood pressure 144/81, pulse 98, temperature 98.2 F (36.8 C), temperature source Oral, resp. rate 22, height 5' 7.5" (1.715 m), weight 262 lb (118.842 kg), SpO2 97.00%. Physical Exam  Constitutional: She appears well-developed and well-nourished.  HENT:  Mouth/Throat: Oropharynx is clear and moist.  Eyes: Conjunctivae are normal. No  scleral icterus.  Neck: No thyromegaly present.  Cardiovascular: Normal rate, regular rhythm and normal heart sounds.   No murmur heard. Respiratory: Effort normal and breath sounds normal.  GI: Soft. She exhibits no distension and no mass. There is no tenderness.  Musculoskeletal: She exhibits no edema.  Lymphadenopathy:    She has no cervical adenopathy.  Neurological: She is alert.  Skin: Skin is warm and dry.     Assessment/Plan Average risk screening colonoscopy.   REHMAN,NAJEEB U 08/30/2012, 9:21 AM

## 2012-09-03 ENCOUNTER — Encounter (HOSPITAL_COMMUNITY): Payer: Self-pay | Admitting: Internal Medicine

## 2012-09-03 ENCOUNTER — Encounter (INDEPENDENT_AMBULATORY_CARE_PROVIDER_SITE_OTHER): Payer: Self-pay | Admitting: *Deleted

## 2012-09-24 IMAGING — CT CT MAXILLOFACIAL W/O CM
3 series · 15 of 47 positions shown, 18 images · non-contrast
Comparison: CT head 04/25/2007

CLINICAL DATA: Question right, infection.  Right orbital
prosthetic.

CT MAXILLOFACIAL WITHOUT CONTRAST
TECHNIQUE: Multidetector CT imaging of the maxillofacial
structures was performed. Multiplanar CT image reconstructions were
also generated.

[Series 2: max st 2.0 h31s · axial · 0.34mm/px · z∈[-24,+164]mm · 9 of 110 slices shown, 12 images]
[im 8/110  brain]
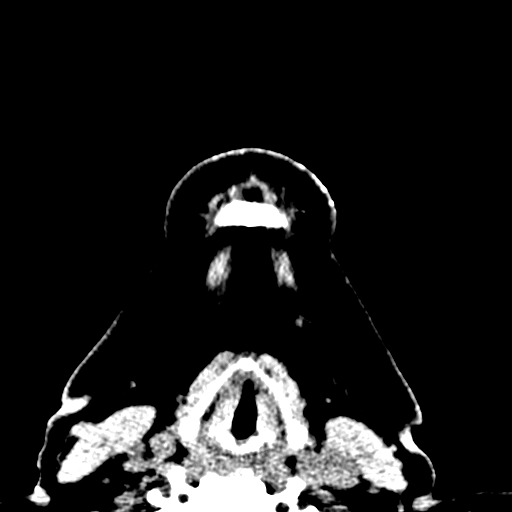
[im 8/110  bone]
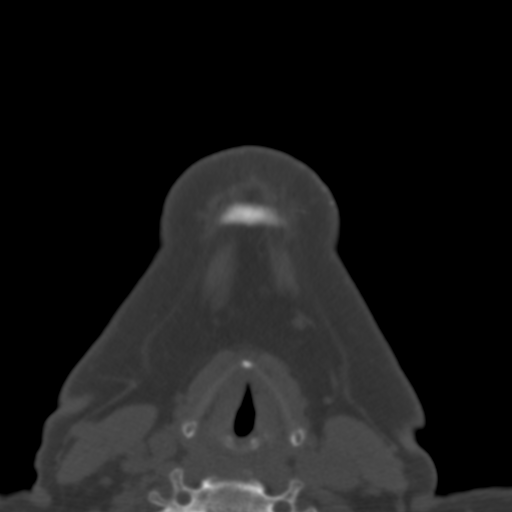
[im 19/110  bone]
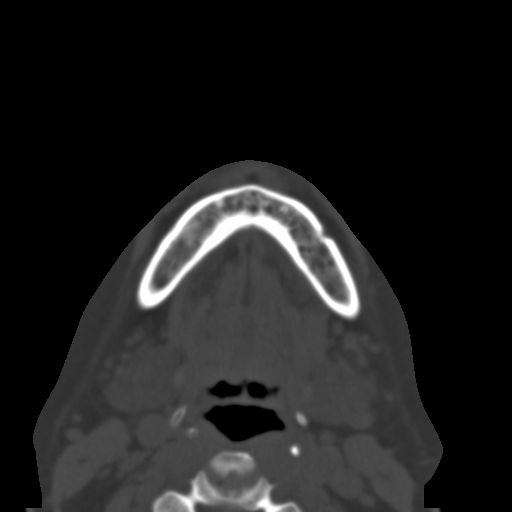
[im 31/110  bone]
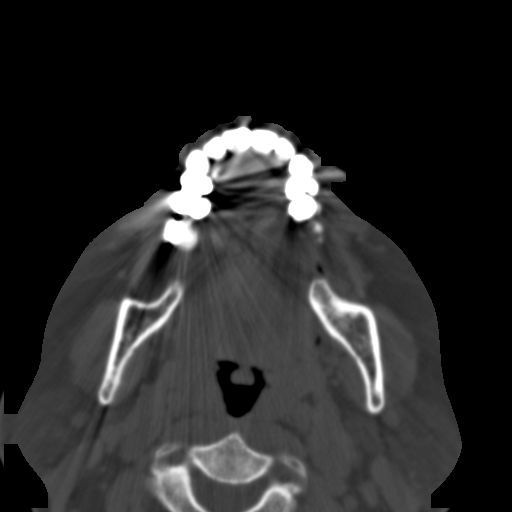
[im 42/110  bone]
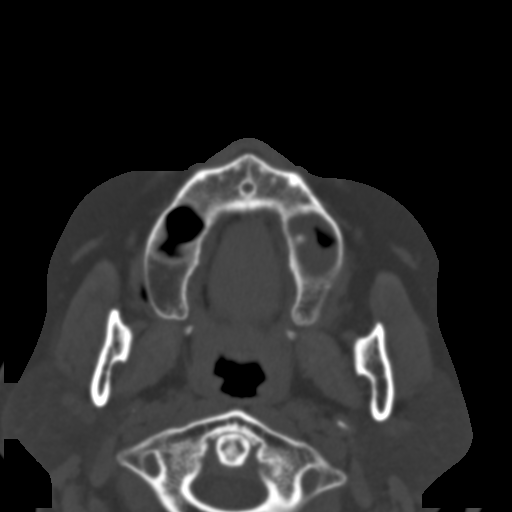
[im 57/110  brain]
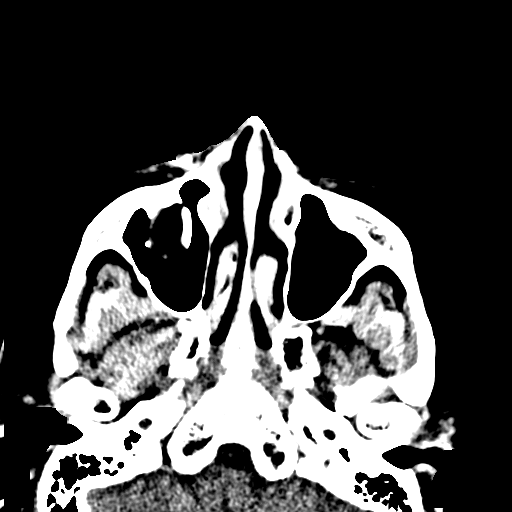
[im 57/110  bone]
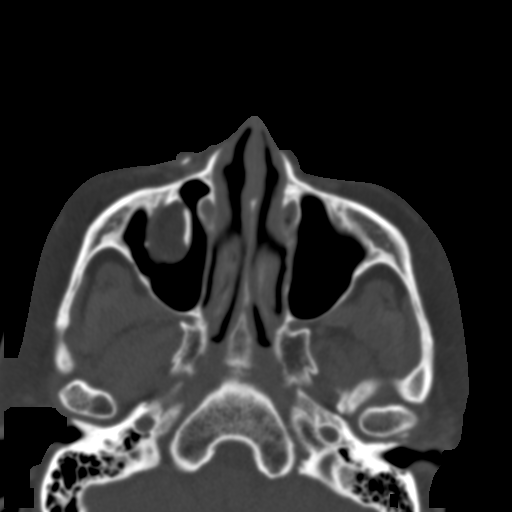
[im 68/110  bone]
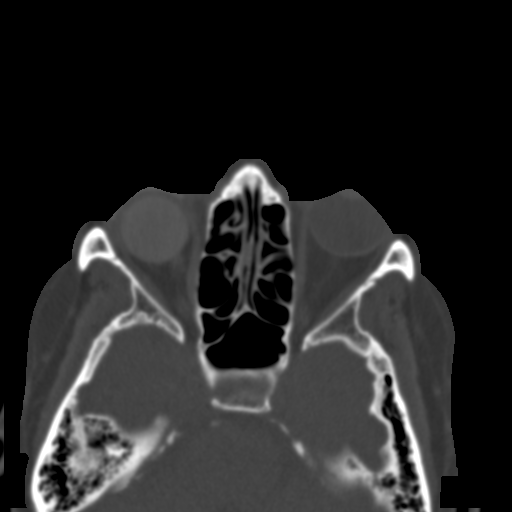
[im 79/110  bone]
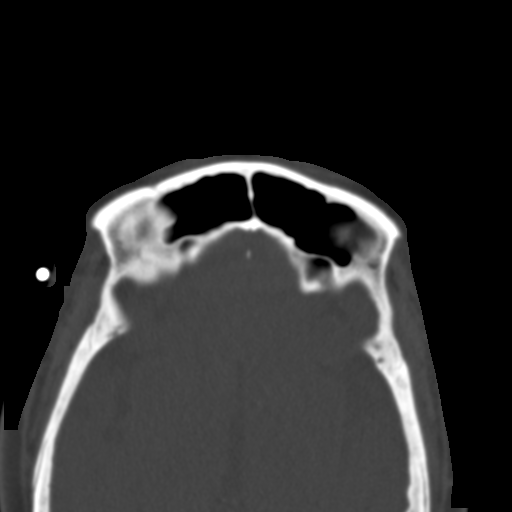
[im 91/110  bone]
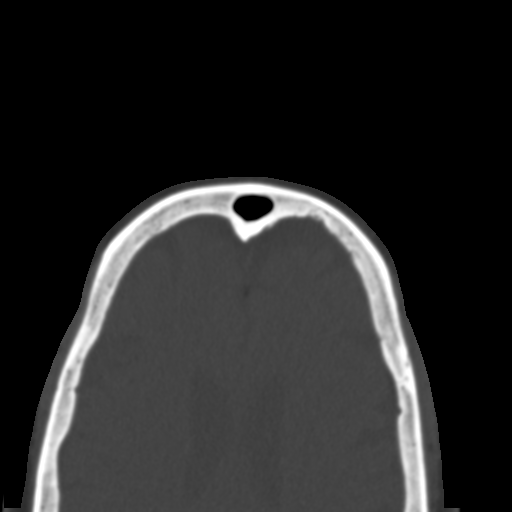
[im 102/110  brain]
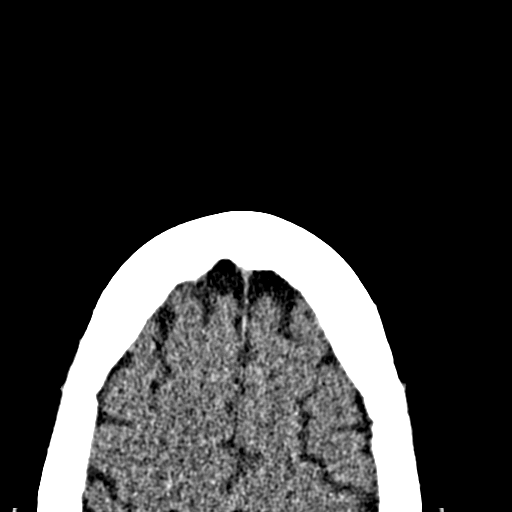
[im 102/110  bone]
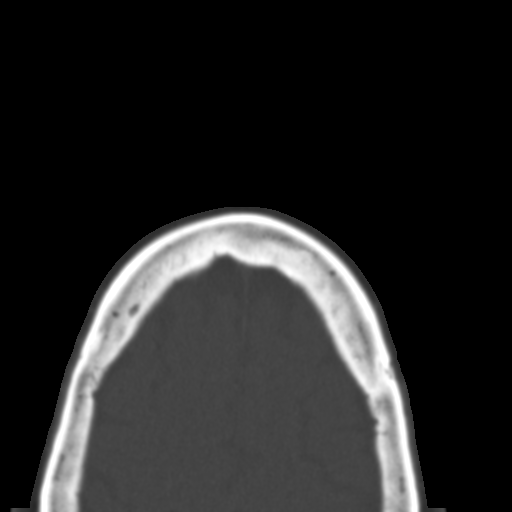

[Series 4: max st coronal · coronal · 0.41mm/px · 3 of 76 slices shown]
[im 26/76  bone]
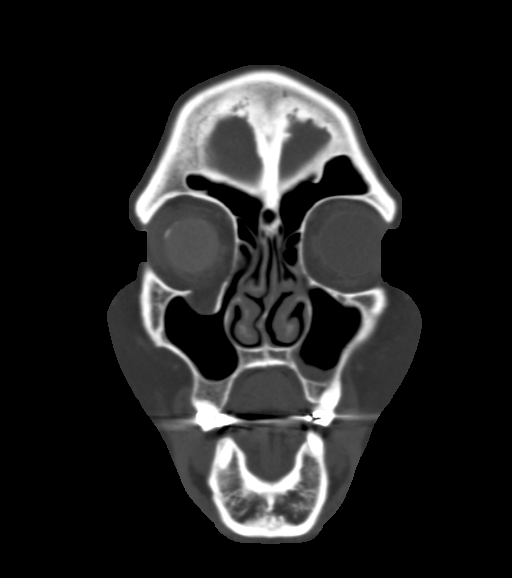
[im 34/76  bone]
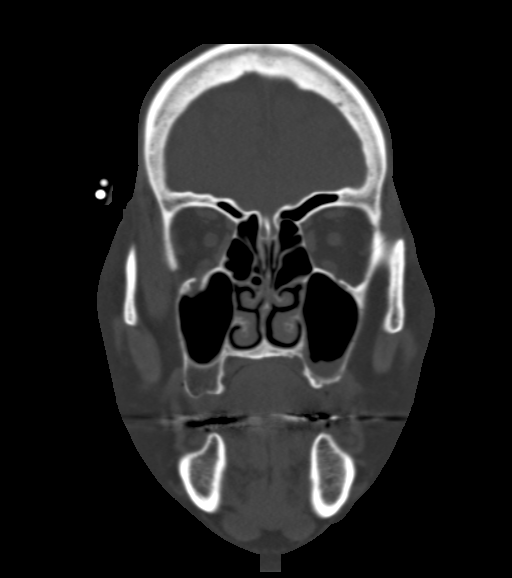
[im 42/76  bone]
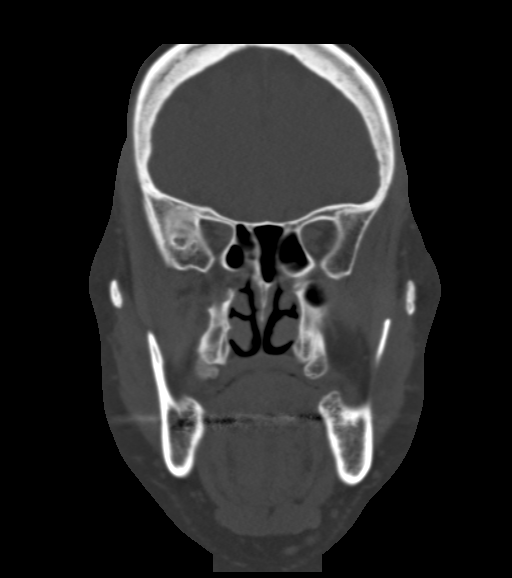

[Series 5: max st sag · sagittal · 0.35mm/px · 3 of 100 slices shown]
[im 34/100  bone]
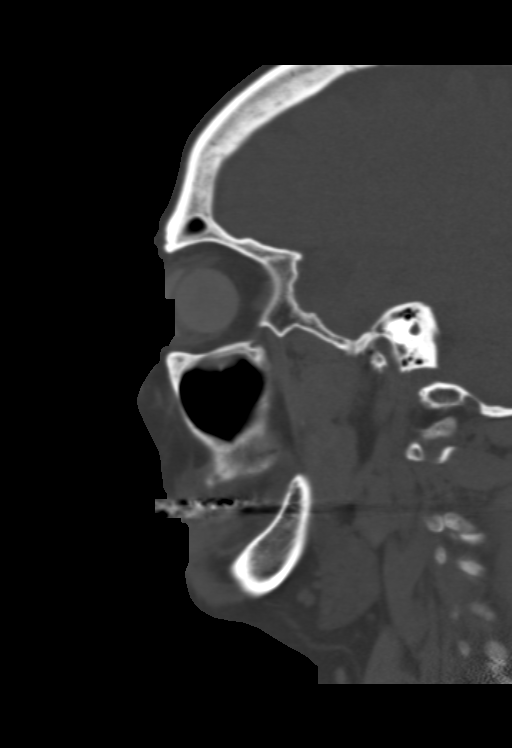
[im 50/100  bone]
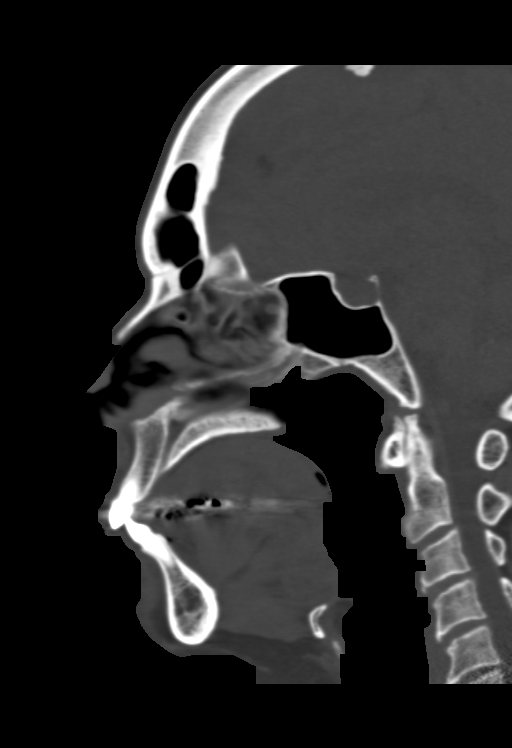
[im 67/100  bone]
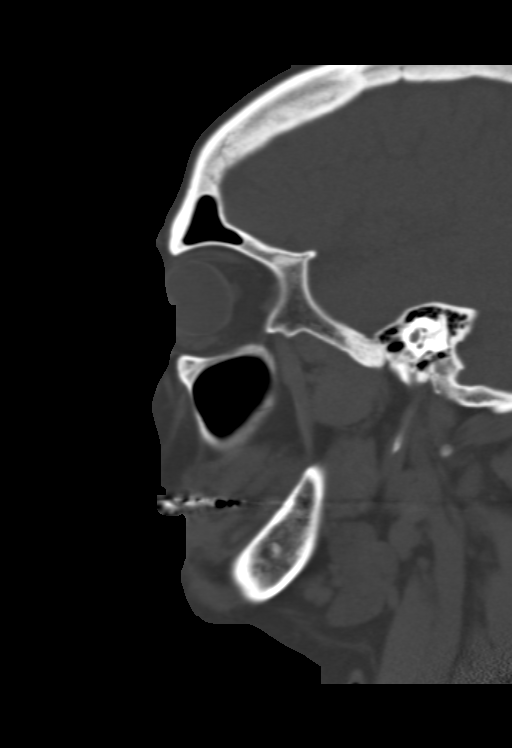

[15 of 47 positions shown; findings below may reference images not displayed]

FINDINGS: Right orbital prosthesis is noted in place.  Mild
stranding in the subcutaneous soft tissues along the inferior right
orbit and upper right cheek.  No focal fluid collection to suggest
abscess.  I see no intraorbital soft tissue abnormality.  Left
globe is unremarkable.  Mucoperiosteal thickening in the ethmoid
air cells and maxillary sinuses compatible with chronic sinusitis.
No air fluid levels.  No acute bony abnormality.
IMPRESSION: Slight stranding in the preorbital soft tissues and some of the
soft tissues along the upper right cheek.  This may reflect mild
cellulitis.  No evidence of abscess.

## 2012-09-26 ENCOUNTER — Encounter: Payer: Medicare Other | Admitting: Family Medicine

## 2012-10-04 ENCOUNTER — Encounter: Payer: Self-pay | Admitting: Family Medicine

## 2012-10-04 ENCOUNTER — Ambulatory Visit (INDEPENDENT_AMBULATORY_CARE_PROVIDER_SITE_OTHER): Payer: Medicare Other | Admitting: Family Medicine

## 2012-10-04 VITALS — BP 118/80 | HR 80 | Resp 16 | Ht 67.0 in | Wt 257.1 lb

## 2012-10-04 DIAGNOSIS — E1129 Type 2 diabetes mellitus with other diabetic kidney complication: Secondary | ICD-10-CM

## 2012-10-04 DIAGNOSIS — N058 Unspecified nephritic syndrome with other morphologic changes: Secondary | ICD-10-CM

## 2012-10-04 DIAGNOSIS — E785 Hyperlipidemia, unspecified: Secondary | ICD-10-CM

## 2012-10-04 DIAGNOSIS — I10 Essential (primary) hypertension: Secondary | ICD-10-CM

## 2012-10-04 DIAGNOSIS — E1121 Type 2 diabetes mellitus with diabetic nephropathy: Secondary | ICD-10-CM

## 2012-10-04 DIAGNOSIS — Z0000 Encounter for general adult medical examination without abnormal findings: Secondary | ICD-10-CM

## 2012-10-04 MED ORDER — POTASSIUM CHLORIDE CRYS ER 20 MEQ PO TBCR: EXTENDED_RELEASE_TABLET | ORAL | Status: DC

## 2012-10-04 MED ORDER — METOPROLOL SUCCINATE ER 200 MG PO TB24: 200.0000 mg | ORAL_TABLET | Freq: Every day | ORAL | Status: DC

## 2012-10-04 MED ORDER — TRIAMTERENE-HCTZ 75-50 MG PO TABS: ORAL_TABLET | ORAL | Status: DC

## 2012-10-04 NOTE — Progress Notes (Signed)
Subjective:    Patient ID: Tammy Hurley, female    DOB: August 11, 1945, 67 y.o.   MRN: [id number]  HPI Preventive Screening-Counseling & Management   Patient present here today for a Medicare annual wellness visit.   Current Problems (verified)   Medications Prior to Visit Allergies (verified)   PAST HISTORY  Family History 5 siblings all alive, no cancer , heart attack or stroke, no depression, and dementia Mom died at 18 had cancer spread to liver, father died at age 67 had dementia in her 66's  Social History Divorced, mom of 3 kids, no current alcohol , tobacco or street drug use Disabled since age 43 due to arthritis   Risk Factors  Current exercise habits:  Daily pool exercises at [facility], 3 to 4 days per week  Dietary issues discussed:low carb and low fat   Cardiac risk factors:   Depression Screen  (Note: if answer to either of the following is "Yes", a more complete depression screening is indicated)   Over the past two weeks, have you felt down, depressed or hopeless? No  Over the past two weeks, have you felt little interest or pleasure in doing things? No  Have you lost interest or pleasure in daily life? No  Do you often feel hopeless? No  Do you cry easily over simple problems? No   Activities of Daily Living  In your present state of health, do you have any difficulty performing the following activities?  Driving?: No, limitation due to monocular vision, night driving is avoided Managing money?: No Feeding yourself?:No Getting from bed to chair?:No Climbing a flight of stairs?:limitation Preparing food and eating?:No Bathing or showering?:No Getting dressed?:No Getting to the toilet?:No Using the toilet?:No Moving around from place to place?: at times  Fall Risk Assessment In the past year have you fallen or had a near fall?:No Are you currently taking any medications that make you dizziness?:No   Hearing Difficulties: yes Do you often ask people  to speak up or repeat themselves?:yes Do you experience ringing or noises in your ears?:No Do you have difficulty understanding soft or whispered voices?:No  Cognitive Testing  Alert? Yes Normal Appearance?Yes  Oriented to person? Yes Place? Yes  Time? Yes  Displays appropriate judgment?Yes  Can read the correct time from a watch face? yes Are you having problems remembering things?No  Advanced Directives have been discussed with the patient?not at this visit, will address when pt next comes to the office   List the Names of Other Physician/Practitioners you currently use: Dr Karilyn Cota, Dr Maple Hudson, Dr Jackquline Bosch, Dr Manson Passey, Adolph Pollack card   Indicate any recent Medical Services you may have received from other than [facility] providers in the past year (date may be approximate).   Assessment:    Annual Wellness Exam   Plan:    During the course of the visit the patient was educated and counseled about appropriate screening and preventive services including:  A healthy diet is rich in fruit, vegetables and whole grains. Poultry fish, nuts and beans are a healthy choice for protein rather then red meat. A low sodium diet and drinking 64 ounces of water daily is generally recommended. Oils and sweet should be limited. Carbohydrates especially for those who are diabetic or overweight, should be limited to 30-45 gram per meal. It is important to eat on a regular schedule, at least 3 times daily. Snacks should be primarily fruits, vegetables or nuts. It is important that you exercise regularly at least  30 minutes 5 times a week. If you develop chest pain, have severe difficulty breathing, or feel very tired, stop exercising immediately and seek medical attention  Immunization reviewed and updated. Cancer screening reviewed and updated    Patient Instructions (the written plan) was given to the patient.  Medicare Attestation  I have personally reviewed:  The patient's medical and social history  Their  use of alcohol, tobacco or illicit drugs  Their current medications and supplements  The patient's functional ability including ADLs,fall risks, home safety risks, cognitive, and hearing and visual impairment  Diet and physical activities  Evidence for depression or mood disorders  The patient's weight, height, BMI, and visual acuity have been recorded in the chart. I have made referrals, counseling, and provided education to the patient based on review of the above and I have provided the patient with a written personalized care plan for preventive services.      Review of Systems     Objective:   Physical Exam        Assessment & Plan:

## 2012-10-04 NOTE — Patient Instructions (Addendum)
F/u in 4.5 month, call if you need me before  We will contact Dr Roxy Cedar office re sleep apnea and get back to you   Continue to commit to daily exercise and good blood sugar control  Microalb today from office  Please reconsider the shingles vaccine thios is recommended one time only for everyone  Fasting lipid in 4.5 month with cmp and EGFr before the visit

## 2012-10-05 ENCOUNTER — Telehealth: Payer: Self-pay | Admitting: Internal Medicine

## 2012-10-05 DIAGNOSIS — G4733 Obstructive sleep apnea (adult) (pediatric): Secondary | ICD-10-CM

## 2012-10-05 LAB — MICROALBUMIN / CREATININE URINE RATIO
Creatinine, Urine: 206.5 mg/dL
Microalb Creat Ratio: 8.7 mg/g (ref 0.0–30.0)
Microalb, Ur: 1.8 mg/dL (ref 0.00–1.89)

## 2012-10-05 NOTE — Telephone Encounter (Signed)
LMTCB

## 2012-10-07 DIAGNOSIS — Z0000 Encounter for general adult medical examination without abnormal findings: Secondary | ICD-10-CM | POA: Insufficient documentation

## 2012-10-07 NOTE — Assessment & Plan Note (Signed)
Annual wellness completed as documented. Advanced directives will need to addressed at the next visit. Pt is fully functional, despite uniocular vision, and severe arthritis. 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

## 2012-10-10 NOTE — Telephone Encounter (Signed)
LMTCB

## 2012-10-12 NOTE — Telephone Encounter (Signed)
Spoke with patient-she is aware of sleep study ordered and will await a call from our PCC's to get scheduled.

## 2012-10-12 NOTE — Telephone Encounter (Signed)
LMTCB-if no call back today then I will send a letter to patients home address on file.

## 2012-10-12 NOTE — Telephone Encounter (Signed)
Pt returned [name]'s call.  Pt states she has been out of town, but should be available now.  Pt called from home #.  Tammy Hurley

## 2012-10-15 ENCOUNTER — Ambulatory Visit (HOSPITAL_BASED_OUTPATIENT_CLINIC_OR_DEPARTMENT_OTHER): Payer: Medicare Other | Attending: Internal Medicine

## 2012-10-15 VITALS — Ht 68.0 in | Wt 255.0 lb

## 2012-10-15 DIAGNOSIS — G4733 Obstructive sleep apnea (adult) (pediatric): Secondary | ICD-10-CM | POA: Insufficient documentation

## 2012-10-21 DIAGNOSIS — R0609 Other forms of dyspnea: Secondary | ICD-10-CM

## 2012-10-21 DIAGNOSIS — G4733 Obstructive sleep apnea (adult) (pediatric): Secondary | ICD-10-CM

## 2012-10-21 DIAGNOSIS — R0989 Other specified symptoms and signs involving the circulatory and respiratory systems: Secondary | ICD-10-CM

## 2012-10-21 NOTE — Procedures (Signed)
NAMETAKERRA, Tammy Hurley               ACCOUNT NO.:  [id number]  MEDICAL RECORD NO.:  [id number]          PATIENT TYPE:  OUT  LOCATION:  SLEEP CENTER                 FACILITY:  [facility]  PHYSICIAN:  Clinton D. Maple Hudson, MD, FCCP, FACPDATE OF BIRTH:  1946/04/09  DATE OF STUDY:  10/15/2012                           NOCTURNAL POLYSOMNOGRAM  REFERRING PHYSICIAN:  Clinton D. Maple Hudson, MD, FCCP, FACP  REFERRING PHYSICIAN:  Clinton D. Young, MD, FCCP, FACP  INDICATION FOR STUDY:  Insomnia with sleep apnea.  EPWORTH SLEEPINESS SCORE:  5/24.  BMI 38.8, weight 255 pounds.  Height 68 inches, neck 15 inches.  MEDICATIONS:  Home medications are charted and reviewed.  SLEEP ARCHITECTURE:  Total sleep time 357 minutes with sleep efficiency 96.9%.  Stage I is 9.8%, stage II 61.9%, stage III absent.  REM 28.3% of total sleep time.  Sleep latency 5 minutes, REM latency 68 minutes. Awake after sleep onset 6.5 minutes.  Arousal index 3.2.  There is no bedtime medication taken.  RESPIRATORY DATA:  Apnea-hypopnea index (AHI) 18.8 per hour.  A total of 112 events was scored including 2 obstructive apneas, 1 central apnea, 109 hypopneas.  All events were associated with supine sleep position. REM AHI 47.5 per hour.  There were not enough early events to qualify for split protocol CPAP titration on this study.  OXYGEN DATA:  Moderate snoring with oxygen desaturation to a nadir of 79% and mean oxygen level through the study of 93.5% on room air.  CARDIAC DATA:  Sinus rhythm with PACs.  MOVEMENT-PARASOMNIA:  A few incidental limb jerks were noted.  There are no bathroom trips.  IMPRESSIONS-RECOMMENDATIONS: 1. Moderate obstructive sleep apnea/hypopnea syndrome, AHI 18.8 per     hour with mainly REM associated events while supine.  Moderate     snoring with oxygen desaturation to a nadir of 79% and mean oxygen     saturation     through the study of 93.5% on room air. 2. This patient could return for dedicated  CPAP titration if     appropriate.     Clinton D. Maple Hudson, MD, [facility], FACP Diplomate, American Board of Sleep Medicine    CDY/MEDQ  D:  10/21/2012 15:00:19  T:  10/21/2012 20:00:12  Job:  [id number]

## 2012-11-01 ENCOUNTER — Other Ambulatory Visit: Payer: Self-pay

## 2012-12-19 ENCOUNTER — Encounter: Payer: Self-pay | Admitting: Family Medicine

## 2012-12-19 ENCOUNTER — Ambulatory Visit (INDEPENDENT_AMBULATORY_CARE_PROVIDER_SITE_OTHER): Payer: Medicare Other | Admitting: Family Medicine

## 2012-12-19 VITALS — BP 126/78 | HR 94 | Temp 98.9°F | Resp 18 | Ht 67.0 in | Wt 254.0 lb

## 2012-12-19 DIAGNOSIS — I10 Essential (primary) hypertension: Secondary | ICD-10-CM

## 2012-12-19 DIAGNOSIS — J209 Acute bronchitis, unspecified: Secondary | ICD-10-CM | POA: Insufficient documentation

## 2012-12-19 DIAGNOSIS — J029 Acute pharyngitis, unspecified: Secondary | ICD-10-CM | POA: Insufficient documentation

## 2012-12-19 LAB — POCT RAPID STREP A (OFFICE): Rapid Strep A Screen: NEGATIVE

## 2012-12-19 MED ORDER — PROMETHAZINE-DM 6.25-15 MG/5ML PO SYRP: ORAL_SOLUTION | ORAL | Status: AC

## 2012-12-19 MED ORDER — FLUCONAZOLE 150 MG PO TABS: ORAL_TABLET | ORAL | Status: AC

## 2012-12-19 MED ORDER — AMOXICILLIN 500 MG PO CAPS: 500.0000 mg | ORAL_CAPSULE | Freq: Three times a day (TID) | ORAL | Status: AC

## 2012-12-19 MED ORDER — CEFTRIAXONE SODIUM 1 G IJ SOLR
500.0000 mg | Freq: Once | INTRAMUSCULAR | Status: AC
  Administered 2012-12-19: 500 mg via INTRAMUSCULAR

## 2012-12-19 NOTE — Patient Instructions (Addendum)
F/u as before  You have acute bronchitis. Rocephin 500 mg IM in the office today, and amoxicillin for 10 days. Take the decongestant perles you have to help with congestion , also sugar free robitussin is fine.  You will be handed prescriptions to fill if you need to , for a cough suppressant syrup at bedtime, if you are bothered by excessive cough, also for tablets for vaginal itching from yeast infection , which some people get when they take a course of antibiotics  Please get CXr today.  Fluid and rest please  Rapid strep test on throat is negative  Acute Bronchitis You have acute bronchitis. This means you have a chest cold. The airways in your lungs are red and sore (inflamed). Acute means it is sudden onset.  CAUSES Bronchitis is most often caused by the same virus that causes a cold. SYMPTOMS   Body aches.  Chest congestion.  Chills.  Cough.  Fever.  Shortness of breath.  Sore throat. TREATMENT  Acute bronchitis is usually treated with rest, fluids, and medicines for relief of fever or cough. Most symptoms should go away after a few days or a week. Increased fluids may help thin your secretions and will prevent dehydration. Your caregiver may give you an inhaler to improve your symptoms. The inhaler reduces shortness of breath and helps control cough. You can take over-the-counter pain relievers or cough medicine to decrease coughing, pain, or fever. A cool-air vaporizer may help thin bronchial secretions and make it easier to clear your chest. Antibiotics are usually not needed but can be prescribed if you smoke, are seriously ill, have chronic lung problems, are elderly, or you are at higher risk for developing complications.Allergies and asthma can make bronchitis worse. Repeated episodes of bronchitis may cause longstanding lung problems. Avoid smoking and secondhand smoke.Exposure to cigarette smoke or irritating chemicals will make bronchitis worse. If you are a  cigarette smoker, consider using nicotine gum or skin patches to help control withdrawal symptoms. Quitting smoking will help your lungs heal faster. Recovery from bronchitis is often slow, but you should start feeling better after 2 to 3 days. Cough from bronchitis frequently lasts for 3 to 4 weeks. To prevent another bout of acute bronchitis:  Quit smoking.  Wash your hands frequently to get rid of viruses or use a hand sanitizer.  Avoid other people with cold or virus symptoms.  Try not to touch your hands to your mouth, nose, or eyes. SEEK IMMEDIATE MEDICAL CARE IF:  You develop increased fever, chills, or chest pain.  You have severe shortness of breath or bloody sputum.  You develop dehydration, fainting, repeated vomiting, or a severe headache.  You have no improvement after 1 week of treatment or you get worse. MAKE SURE YOU:   Understand these instructions.  Will watch your condition.  Will get help right away if you are not doing well or get worse. Document Released: 05/19/2004 Document Revised: 07/04/2011 Document Reviewed: 08/04/2010 [facility] Patient Information 2014 [place], [place].

## 2012-12-19 NOTE — Assessment & Plan Note (Signed)
Rocephin in office followed by 10 day antibiotic course Tessalon perles for decongestion. Scripts given to pt to fill if needed, for phenergan DM as cough suppressant and also for fluconazole as needed , for vaginal itch

## 2012-12-19 NOTE — Assessment & Plan Note (Signed)
Controlled, no change in medication  

## 2012-12-19 NOTE — Assessment & Plan Note (Signed)
Rapid strep test is negative.

## 2012-12-19 NOTE — Progress Notes (Signed)
  Subjective:    Patient ID: Tammy Hurley, female    DOB: [birth year], 67 y.o.   MRN: [id number]  HPI 1 week h/o increased head and chest congestion progressively worsening, now sputum is yellow, has had chills.  2 day h/o acute sore throat. No documented fever Prior to this had been well   Review of Systems See HPI Denies chest pains, palpitations and leg swelling Denies abdominal pain, nausea, vomiting,diarrhea or constipation.   Denies dysuria, frequency, hesitancy or incontinence. Denies joint pain, swelling and limitation in mobility. Denies headaches, seizures, numbness, or tingling. Denies depression, anxiety or insomnia. Denies skin break down or rash.        Objective:   Physical Exam Patient alert and oriented and in no cardiopulmonary distress.Ill appearing  HEENT: No facial asymmetry, EOMI, no sinus tenderness,  oropharynx erythematous , no exudate and moist.  Neck supple no adenopathy.  Chest: Adequate air entry throughout, few crackles, no wheezes CVS: S1, S2 no murmurs, no S3.  ABD: Soft non tender. Bowel sounds normal.  Ext: No edema  MS: Adequate ROM spine, shoulders, hips and knees.  Skin: Intact, no ulcerations or rash noted.  Psych: Good eye contact, normal affect. Memory intact not anxious or depressed appearing.  CNS: CN 2-12 intact, power, tone and sensation normal throughout.        Assessment & Plan:

## 2013-02-14 ENCOUNTER — Ambulatory Visit: Payer: Medicare Other | Admitting: Family Medicine

## 2013-02-18 ENCOUNTER — Other Ambulatory Visit: Payer: Self-pay | Admitting: Family Medicine

## 2013-02-26 ENCOUNTER — Encounter (INDEPENDENT_AMBULATORY_CARE_PROVIDER_SITE_OTHER): Payer: Self-pay

## 2013-02-26 ENCOUNTER — Ambulatory Visit (INDEPENDENT_AMBULATORY_CARE_PROVIDER_SITE_OTHER): Payer: Medicare Other | Admitting: Family Medicine

## 2013-02-26 ENCOUNTER — Encounter: Payer: Self-pay | Admitting: Family Medicine

## 2013-02-26 VITALS — BP 128/76 | HR 93 | Resp 16 | Wt 252.0 lb

## 2013-02-26 DIAGNOSIS — K219 Gastro-esophageal reflux disease without esophagitis: Secondary | ICD-10-CM

## 2013-02-26 DIAGNOSIS — Z139 Encounter for screening, unspecified: Secondary | ICD-10-CM

## 2013-02-26 DIAGNOSIS — Z1239 Encounter for other screening for malignant neoplasm of breast: Secondary | ICD-10-CM

## 2013-02-26 DIAGNOSIS — R5383 Other fatigue: Secondary | ICD-10-CM

## 2013-02-26 DIAGNOSIS — E1129 Type 2 diabetes mellitus with other diabetic kidney complication: Secondary | ICD-10-CM

## 2013-02-26 DIAGNOSIS — E1121 Type 2 diabetes mellitus with diabetic nephropathy: Secondary | ICD-10-CM

## 2013-02-26 DIAGNOSIS — M1990 Unspecified osteoarthritis, unspecified site: Secondary | ICD-10-CM

## 2013-02-26 DIAGNOSIS — E669 Obesity, unspecified: Secondary | ICD-10-CM

## 2013-02-26 DIAGNOSIS — N058 Unspecified nephritic syndrome with other morphologic changes: Secondary | ICD-10-CM

## 2013-02-26 DIAGNOSIS — R5381 Other malaise: Secondary | ICD-10-CM

## 2013-02-26 DIAGNOSIS — I10 Essential (primary) hypertension: Secondary | ICD-10-CM

## 2013-02-26 DIAGNOSIS — E785 Hyperlipidemia, unspecified: Secondary | ICD-10-CM

## 2013-02-26 NOTE — Patient Instructions (Addendum)
Pelvic and breast June 15 or after , call if you need me before  We will request victoza x 2 for you to use in December while still in the [place]ut [place] and call you when we get the samples (hopefullly this week)  Fasting lipid, cmp and EGFr , CBc and vit D in June approx 1 week before visit.  Continue healthy eating and regular activity so you continue to lose weight, and feel healthier

## 2013-02-26 NOTE — Progress Notes (Signed)
  Subjective:    Patient ID: Tammy Hurley, female    DOB: 1945-09-26, 67 y.o.   MRN: [id number]  HPI The PT is here for follow up and re-evaluation of chronic medical conditions, medication management and review of any available recent lab and radiology data.  Preventive health is updated, specifically  Cancer screening and Immunization.   Questions or concerns regarding consultations or procedures which the PT has had in the interim are  Addressed.Keeps endo appts in [place] with endocrine and is doing well, concerns with victoza as in donut hole , will help her with this The PT denies any adverse reactions to current medications since the last visit.  There are no new concerns. Happy now out to be out part time for the past 2 months There are no specific complaints  Denies polyuria, polydipsia, or hypoglycemic episodes      Review of Systems See HPI Denies recent fever or chills. Denies sinus pressure, nasal congestion, ear pain or sore throat. Denies chest congestion, productive cough or wheezing. Denies chest pains, palpitations and leg swelling Denies abdominal pain, nausea, vomiting,diarrhea or constipation.   Denies dysuria, frequency, hesitancy or incontinence. Denies uncontrolled  joint pain, swelling and limitation in mobility. Denies headaches, seizures, numbness, or tingling. Denies depression, anxiety or insomnia. Denies skin break down or rash.        Objective:   Physical Exam  Patient alert and oriented and in no cardiopulmonary distress.  HEENT: No facial asymmetry, EOMI, no sinus tenderness,  oropharynx pink and moist.  Neck supple no adenopathy.  Chest: Clear to auscultation bilaterally.  CVS: S1, S2 no murmurs, no S3.  ABD: Soft non tender. Bowel sounds normal.  Ext: No edema  MS: Adequate though reduced  ROM spine,  hips and knees.Normal ROM in shoulders  Skin: Intact, no ulcerations or rash noted.  Psych: Good eye contact, normal affect.  Memory intact not anxious or depressed appearing.  CNS: CN 2-12 intact, power, tone and sensation normal throughout.       Assessment & Plan:

## 2013-03-03 NOTE — Assessment & Plan Note (Signed)
Decreased pain withj more activity, now out on part time job 5 days per week

## 2013-03-03 NOTE — Assessment & Plan Note (Signed)
Controlled, no change in medication DASH diet and commitment to daily physical activity for a minimum of 30 minutes discussed and encouraged, as a part of hypertension management. The importance of attaining a healthy weight is also discussed.  

## 2013-03-03 NOTE — Assessment & Plan Note (Signed)
Controlled, no change in medication  

## 2013-03-03 NOTE — Assessment & Plan Note (Signed)
Controlled and followed by endocrinologist in [place]. Patient advised to reduce carb and sweets, commit to regular physical activity, take meds as prescribed, test blood as directed, and attempt to lose weight, to improve blood sugar control.

## 2013-03-03 NOTE — Assessment & Plan Note (Signed)
Unchanged. Patient re-educated about  the importance of commitment to a  minimum of 150 minutes of exercise per week. The importance of healthy food choices with portion control discussed. Encouraged to start a food diary, count calories and to consider  joining a support group. Sample diet sheets offered. Goals set by the patient for the next several months.    

## 2013-03-03 NOTE — Assessment & Plan Note (Signed)
Hyperlipidemia:Low fat diet discussed and encouraged.  Pt has elevated  Liver enzymes, not on statin therapy currently , will re address at next visit. Hopefully with weight l;ss her enzymes will normalize

## 2013-03-05 ENCOUNTER — Ambulatory Visit (HOSPITAL_COMMUNITY): Payer: Medicare Other

## 2013-03-29 ENCOUNTER — Ambulatory Visit (HOSPITAL_COMMUNITY)
Admission: RE | Admit: 2013-03-29 | Discharge: 2013-03-29 | Disposition: A | Discharge status: Home / Self Care | Payer: Medicare Other | Source: Ambulatory Visit | Attending: Family Medicine | Admitting: Family Medicine

## 2013-03-29 DIAGNOSIS — Z1239 Encounter for other screening for malignant neoplasm of breast: Secondary | ICD-10-CM

## 2013-03-29 DIAGNOSIS — Z1231 Encounter for screening mammogram for malignant neoplasm of breast: Secondary | ICD-10-CM | POA: Insufficient documentation

## 2013-04-11 ENCOUNTER — Encounter (HOSPITAL_COMMUNITY): Payer: Self-pay | Admitting: Emergency Medicine

## 2013-04-11 ENCOUNTER — Emergency Department (HOSPITAL_COMMUNITY)
Admission: EM | Admit: 2013-04-11 | Discharge: 2013-04-11 | Disposition: A | Discharge status: Home / Self Care | Payer: Medicare Other | Attending: Emergency Medicine | Admitting: Emergency Medicine

## 2013-04-11 DIAGNOSIS — E119 Type 2 diabetes mellitus without complications: Secondary | ICD-10-CM | POA: Insufficient documentation

## 2013-04-11 DIAGNOSIS — M1990 Unspecified osteoarthritis, unspecified site: Secondary | ICD-10-CM | POA: Insufficient documentation

## 2013-04-11 DIAGNOSIS — Z7982 Long term (current) use of aspirin: Secondary | ICD-10-CM | POA: Insufficient documentation

## 2013-04-11 DIAGNOSIS — F430 Acute stress reaction: Secondary | ICD-10-CM | POA: Insufficient documentation

## 2013-04-11 DIAGNOSIS — Z9089 Acquired absence of other organs: Secondary | ICD-10-CM | POA: Insufficient documentation

## 2013-04-11 DIAGNOSIS — Z79899 Other long term (current) drug therapy: Secondary | ICD-10-CM | POA: Insufficient documentation

## 2013-04-11 DIAGNOSIS — K219 Gastro-esophageal reflux disease without esophagitis: Secondary | ICD-10-CM | POA: Insufficient documentation

## 2013-04-11 DIAGNOSIS — Z87891 Personal history of nicotine dependence: Secondary | ICD-10-CM | POA: Insufficient documentation

## 2013-04-11 DIAGNOSIS — F439 Reaction to severe stress, unspecified: Secondary | ICD-10-CM

## 2013-04-11 DIAGNOSIS — E669 Obesity, unspecified: Secondary | ICD-10-CM | POA: Insufficient documentation

## 2013-04-11 DIAGNOSIS — I10 Essential (primary) hypertension: Secondary | ICD-10-CM | POA: Insufficient documentation

## 2013-04-11 DIAGNOSIS — R000 Tachycardia, unspecified: Secondary | ICD-10-CM | POA: Insufficient documentation

## 2013-04-11 HISTORY — DX: Palpitations: R00.2

## 2013-04-11 LAB — POCT I-STAT, CHEM 8
Calcium, Ion: 1.26 mmol/L (ref 1.13–1.30)
Chloride: 102 mEq/L (ref 96–112)
HCT: 40 % (ref 36.0–46.0)
TCO2: 26 mmol/L (ref 0–100)

## 2013-04-11 NOTE — ED Notes (Addendum)
Pt says she had rapid heart rate.for 40 min and feels it is slowing now.  Usually had this happen with low K  Or when she gets upset.    Took  Metoprolol pta - earlier than usual

## 2013-04-11 NOTE — ED Provider Notes (Signed)
CSN: [id number]     Arrival date & time 04/11/13  1914 History   First MD Initiated Contact with Patient 04/11/13 1930 This chart was scribed for Tammy Shi, MD by Valera Castle, ED Scribe. This patient was seen in room APA10/[facility]A10 and the patient's care was started at 7:33 PM.      Chief Complaint  Patient presents with  . Tachycardia    HPI HPI Comments: Tammy Hurley is a 67 y.o. female with h/o supraventricular tachycardia and low potassium who presents to the Emergency Department complaining of sudden, intermittent tachycardia, onset 1 hour PTA after getting upset from a personal matter. She reports taking Metoprolol 200 mg once a day in the evenings, and reports taking it early today after the onset of tachycardia. She states the Metoprolol relieved her symptoms temporarily, but states that she decided to come to the ER when her tachycardia returned. She reports feeling much better currently. She denies any associated symptoms.    PCP - Syliva Overman, MD  Past Medical History  Diagnosis Date  . Supraventricular tachycardia   . Obesity   . Hypertension   . Diabetes mellitus, type 2   . Gastroesophageal reflux disease   . Back pain   . Osteoarthritis   . Hypercholesteremia   . Palpitations    Past Surgical History  Procedure Laterality Date  . Appendectomy    . Cholecystectomy    . Vesicovaginal fistula closure w/ tah    . Left oophorectomy    . 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 Right eye prostheslis  03/07  . Retinal detachment repair w/ scleral buckle le    . Refiting of r prosthesis  03/09  . Colonoscopy N/A 08/30/2012    Procedure: COLONOSCOPY;  Surgeon: Malissa Hippo, MD;  Location: [facility];  Service: Endoscopy;  Laterality: N/A;  830-rescheduled to 930 Ann notified pt  . Abdominal hysterectomy    . Tubal ligation     Family History  Problem Relation Age of Onset  . Cancer Mother     Colon  . Alzheimer's disease Father   . Diabetes Brother      x3  . Colon cancer Neg Hx    History  Substance Use Topics  . Smoking status: Former Smoker -- 0.30 packs/day for 3 years    Types: Cigarettes    Quit date: 04/25/1980  . Smokeless tobacco: Not on file  . Alcohol Use: No     Comment: Ocassionally   OB History   Grav Para Term Preterm Abortions TAB SAB Ect Mult Living                 Review of Systems A complete 10 system review of systems was obtained and all systems are negative except as noted in the HPI and PMH.   Allergies  Clarithromycin  Home Medications   Current Outpatient Rx  Name  Route  Sig  Dispense  Refill  . acetaminophen (TYLENOL) 500 MG tablet   Oral   Take 1,000 mg by mouth every 6 (six) hours as needed.         Marland Kitchen aspirin EC 81 MG tablet   Oral   Take 81 mg by mouth daily.         . Cholecalciferol (VITAMIN D3) 1000 UNIT capsule   Oral   Take 4,000-5,000 Units by mouth daily. Two tabs by mouth once daily         .  Garlic 1000 MG CAPS   Oral   Take 2-3 capsules by mouth daily.          Marland Kitchen glipiZIDE (GLUCOTROL) 5 MG tablet   Oral   Take 5 mg by mouth every morning.         . lansoprazole (PREVACID) 30 MG capsule   Oral   Take 15 mg by mouth daily. One tab by mouth once daily         . Liraglutide (VICT[address])   Subcutaneous   Inject 1.2 mg into the skin at bedtime.          . metoprolol (TOPROL-XL) 200 MG 24 hr tablet   Oral   Take 1 tablet (200 mg total) by mouth daily.   90 tablet   1   . Misc Natural Products (JOINT SUPPORT COMPLEX PO)   Oral   Take 1-2 tablets by mouth daily.         . Multiple Vitamins-Minerals (CENTRUM SILVER ADULT 50+ PO)   Oral   Take 1 tablet by mouth daily.         Marland Kitchen OVER THE COUNTER MEDICATION   Oral   Take 1 tablet by mouth daily. BURDOCK         . Polyethyl Glycol-Propyl Glycol (SYSTANE OP)   Left Eye   Place 1 drop into the left eye daily.         . Potassium 99 MG TABS   Oral   Take 1-2 tablets by mouth daily.          . potassium chloride SA (K-DUR,KLOR-CON) 20 MEQ tablet   Oral   Take 20 mEq by mouth 2 (two) times daily.         . Tetrahydrozoline HCl (EYE DROPS OP)   Ophthalmic   Apply 1 drop to eye daily. Uses in right eye. Called Ocu-glide.         . triamterene-hydrochlorothiazide (MAXZIDE) 75-50 MG per tablet   Oral   Take 1 tablet by mouth at bedtime.         Foye Deer EXTRACT PO   Oral   Take 1 tablet by mouth daily.          BP 145/97  Pulse 88  Temp(Src) 97.5 F (36.4 C) (Oral)  Resp 20  Ht 5\' 8"  (1.727 m)  Wt 240 lb (108.863 kg)  BMI 36.50 kg/m2  SpO2 100%  Physical Exam  Nursing note and vitals reviewed. Constitutional: She is oriented to person, place, and time. She appears well-developed and well-nourished. No distress.  HENT:  Head: Normocephalic and atraumatic.  Eyes: Pupils are equal, round, and reactive to light.  Neck: Normal range of motion.  Cardiovascular: Normal rate and intact distal pulses.   Pulmonary/Chest: No respiratory distress.  Abdominal: Normal appearance. She exhibits no distension. There is no tenderness. There is no rebound.  Musculoskeletal: Normal range of motion.  Neurological: She is alert and oriented to person, place, and time. No cranial nerve deficit.  Skin: Skin is warm and dry. No rash noted.  Psychiatric: She has a normal mood and affect. Her behavior is normal.    ED Course  Procedures (including critical care time)  DIAGNOSTIC STUDIES: Oxygen Saturation is 100% on room air, normal by my interpretation.    COORDINATION OF CARE: 7:38 PM-Discussed treatment plan which includes I-Stat, Chem 8 with pt at bedside and pt agreed to plan.   Labs Review Labs Reviewed  POCT I-STAT, CHEM 8 -  Abnormal; Notable for the following:    BUN 27 (*)    Creatinine, Ser 1.50 (*)    Glucose, Bld 169 (*)    All other components within normal limits   Imaging Review No results found.  EKG Interpretation    Date/Time:  Thursday  April 11 2013 19:23:45 EST Ventricular Rate:  87 PR Interval:  204 QRS Duration: 130 QT Interval:  414 QTC Calculation: 498 R Axis:   -20 Text Interpretation:  Normal sinus rhythm Right bundle branch block Abnormal ECG When compared with ECG of 15-Mar-2009 15:55, Right bundle branch block is now Present Confirmed by Freedom Lopezperez  MD, Gabriell Casimir ([id number]) on 04/11/2013 7:36:19 PM           Meds ordered this encounter  Medications  . potassium chloride SA (K-DUR,KLOR-CON) 20 MEQ tablet    Sig: Take 20 mEq by mouth 2 (two) times daily.  Marland Kitchen triamterene-hydrochlorothiazide (MAXZIDE) 75-50 MG per tablet    Sig: Take 1 tablet by mouth at bedtime.  Marland Kitchen aspirin EC 81 MG tablet    Sig: Take 81 mg by mouth daily.  Marland Kitchen glipiZIDE (GLUCOTROL) 5 MG tablet    Sig: Take 5 mg by mouth every morning.  . Multiple Vitamins-Minerals (CENTRUM SILVER ADULT 50+ PO)    Sig: Take 1 tablet by mouth daily.  Foye Deer EXTRACT PO    Sig: Take 1 tablet by mouth daily.  . Misc Natural Products (JOINT SUPPORT COMPLEX PO)    Sig: Take 1-2 tablets by mouth daily.  Marland Kitchen OVER THE COUNTER MEDICATION    Sig: Take 1 tablet by mouth daily. BURDOCK  . Potassium 99 MG TABS    Sig: Take 1-2 tablets by mouth daily.  Bertram Gala Glycol-Propyl Glycol (SYSTANE OP)    Sig: Place 1 drop into the left eye daily.  Marland Kitchen acetaminophen (TYLENOL) 500 MG tablet    Sig: Take 1,000 mg by mouth every 6 (six) hours as needed.    MDM   1. Tachycardia   2. Stress       I personally performed the services described in this documentation, which was scribed in my presence. The recorded information has been reviewed and considered.    Tammy Shi, MD 04/11/13 [phone]

## 2013-04-29 ENCOUNTER — Telehealth: Payer: Self-pay | Admitting: Family Medicine

## 2013-04-29 MED ORDER — METOPROLOL SUCCINATE ER 200 MG PO TB24: 200.0000 mg | ORAL_TABLET | Freq: Every day | ORAL | Status: DC

## 2013-04-29 NOTE — Telephone Encounter (Signed)
Spoke with patient. She wants to be prescribed the brand name because she states if she misses a dose of the generic she wakes up with heart racing. Never happened on the brand. Will send in and she will call with any other problems

## 2013-05-13 ENCOUNTER — Encounter (INDEPENDENT_AMBULATORY_CARE_PROVIDER_SITE_OTHER): Payer: Self-pay

## 2013-05-13 ENCOUNTER — Encounter: Payer: Self-pay | Admitting: Family Medicine

## 2013-05-13 ENCOUNTER — Ambulatory Visit (INDEPENDENT_AMBULATORY_CARE_PROVIDER_SITE_OTHER): Payer: Medicare HMO | Admitting: Family Medicine

## 2013-05-13 VITALS — BP 132/80 | HR 93 | Resp 18 | Ht 67.0 in

## 2013-05-13 DIAGNOSIS — M109 Gout, unspecified: Secondary | ICD-10-CM

## 2013-05-13 DIAGNOSIS — I10 Essential (primary) hypertension: Secondary | ICD-10-CM

## 2013-05-13 MED ORDER — COLCHICINE 0.6 MG PO TABS: ORAL_TABLET | ORAL | Status: DC

## 2013-05-13 MED ORDER — PREDNISONE 5 MG PO TABS: 5.0000 mg | ORAL_TABLET | Freq: Two times a day (BID) | ORAL | Status: AC

## 2013-05-13 MED ORDER — KETOROLAC TROMETHAMINE 60 MG/2ML IM SOLN
60.0000 mg | Freq: Once | INTRAMUSCULAR | Status: AC
  Administered 2013-05-13: 60 mg via INTRAMUSCULAR

## 2013-05-13 NOTE — Patient Instructions (Signed)
F/u as before  Toradol $Remove'60mg'uFQCRtp$  IM today for gout and 2 medications are also prescribed.  Uric acid level and chem 7 and EGFR today also, we will contact you re labs.  Careful not to fall and hope you feel better soon

## 2013-05-13 NOTE — Progress Notes (Signed)
   Subjective:    Patient ID: Tammy Hurley, female    DOB: Aug 21, 1945, 68 y.o.   MRN: [id number]  HPI 3 day h/o acute pain and swelling of right ankle, no trauma, unable to weight bear last night, needs to return to work in 2 days Has had gout before about 2 years ago and this feels the same, states she had been eating a lot of salmon last week and feels this is the likely trigger got tired of chicken Prior to this has been well, disappointed that not losing weight as she would like   Review of Systems See HPI Denies recent fever or chills. Denies sinus pressure, nasal congestion, ear pain or sore throat. Denies chest congestion, productive cough or wheezing. Denies chest pains, palpitations and leg swelling Denies abdominal pain, nausea, vomiting,diarrhea or constipation.   Denies dysuria, frequency, hesitancy or incontinence.  Denies headaches, seizures, numbness, or tingling.       Objective:   Physical Exam  Patient alert and oriented and in no cardiopulmonary distress.  HEENT: No facial asymmetry, EOMI, no sinus tenderness,  oropharynx pink and moist.  Neck supple no adenopathy.  Chest: Clear to auscultation bilaterally.  CVS: S1, S2 no murmurs, no S3.  ABD: Soft non tender. Bowel sounds normal.  Ext: No edema  MS: Adequate ROM spine, shoulders, hips and knees.Decreased ROM right ankle with warmth and tenderness and swelling  Skin: Intact, no ulcerations or rash noted.  Psych: Good eye contact, normal affect. Memory intact not anxious or depressed appearing.  CNS: CN 2-12 intact, power, tone and sensation normal throughout.       Assessment & Plan:

## 2013-05-13 NOTE — Assessment & Plan Note (Signed)
Controlled, no change in medication DASH diet and commitment to daily physical activity for a minimum of 30 minutes discussed and encouraged, as a part of hypertension management. The importance of attaining a healthy weight is also discussed.  

## 2013-05-13 NOTE — Assessment & Plan Note (Addendum)
Right ankle x 3 days, toradol in office , followed by colchicinre and prednisone short course. Uric acid today

## 2013-05-14 LAB — COMPLETE METABOLIC PANEL WITH GFR
ALBUMIN: 4.4 g/dL (ref 3.5–5.2)
ALK PHOS: 96 U/L (ref 39–117)
ALT: 33 U/L (ref 0–35)
AST: 30 U/L (ref 0–37)
BUN: 24 mg/dL — ABNORMAL HIGH (ref 6–23)
CO2: 26 meq/L (ref 19–32)
Calcium: 9.4 mg/dL (ref 8.4–10.5)
Chloride: 100 mEq/L (ref 96–112)
Creat: 1.56 mg/dL — ABNORMAL HIGH (ref 0.50–1.10)
GFR, EST AFRICAN AMERICAN: 39 mL/min — AB
GFR, EST NON AFRICAN AMERICAN: 34 mL/min — AB
GLUCOSE: 96 mg/dL (ref 70–99)
POTASSIUM: 4 meq/L (ref 3.5–5.3)
Sodium: 138 mEq/L (ref 135–145)
Total Bilirubin: 0.5 mg/dL (ref 0.3–1.2)
Total Protein: 7 g/dL (ref 6.0–8.3)

## 2013-05-14 LAB — URIC ACID: Uric Acid, Serum: 9.4 mg/dL — ABNORMAL HIGH (ref 2.4–7.0)

## 2013-05-15 ENCOUNTER — Other Ambulatory Visit: Payer: Self-pay | Admitting: Family Medicine

## 2013-05-15 ENCOUNTER — Other Ambulatory Visit: Payer: Self-pay

## 2013-05-15 MED ORDER — COLCHICINE 0.6 MG PO TABS: 0.6000 mg | ORAL_TABLET | Freq: Every day | ORAL | Status: DC

## 2013-06-10 ENCOUNTER — Other Ambulatory Visit: Payer: Self-pay | Admitting: Family Medicine

## 2013-09-23 ENCOUNTER — Ambulatory Visit: Payer: 59 | Admitting: Family Medicine

## 2013-09-24 ENCOUNTER — Telehealth: Payer: Self-pay | Admitting: Family Medicine

## 2013-09-24 NOTE — Telephone Encounter (Signed)
rx wirtten for prosthetic eye polishing

## 2013-09-30 ENCOUNTER — Encounter: Payer: Self-pay | Admitting: Family Medicine

## 2013-09-30 ENCOUNTER — Encounter (INDEPENDENT_AMBULATORY_CARE_PROVIDER_SITE_OTHER): Payer: Self-pay

## 2013-09-30 ENCOUNTER — Ambulatory Visit (INDEPENDENT_AMBULATORY_CARE_PROVIDER_SITE_OTHER): Payer: 59 | Admitting: Family Medicine

## 2013-09-30 VITALS — BP 130/82 | HR 66 | Resp 18 | Wt 252.0 lb

## 2013-09-30 DIAGNOSIS — Z13820 Encounter for screening for osteoporosis: Secondary | ICD-10-CM

## 2013-09-30 DIAGNOSIS — I10 Essential (primary) hypertension: Secondary | ICD-10-CM

## 2013-09-30 DIAGNOSIS — M818 Other osteoporosis without current pathological fracture: Secondary | ICD-10-CM

## 2013-09-30 DIAGNOSIS — M1990 Unspecified osteoarthritis, unspecified site: Secondary | ICD-10-CM

## 2013-09-30 DIAGNOSIS — N058 Unspecified nephritic syndrome with other morphologic changes: Secondary | ICD-10-CM

## 2013-09-30 DIAGNOSIS — K219 Gastro-esophageal reflux disease without esophagitis: Secondary | ICD-10-CM

## 2013-09-30 DIAGNOSIS — E1121 Type 2 diabetes mellitus with diabetic nephropathy: Secondary | ICD-10-CM

## 2013-09-30 DIAGNOSIS — E785 Hyperlipidemia, unspecified: Secondary | ICD-10-CM

## 2013-09-30 DIAGNOSIS — E669 Obesity, unspecified: Secondary | ICD-10-CM

## 2013-09-30 DIAGNOSIS — E1129 Type 2 diabetes mellitus with other diabetic kidney complication: Secondary | ICD-10-CM

## 2013-09-30 NOTE — Patient Instructions (Addendum)
Annual physical exam in September, call if you need me before  You are referred for bone density test, and  pls ask your endo to send  Your labs   Pls work on weight loss    Pls sign for eye exam from [facility] done last October  BP is good , no med change

## 2013-10-01 ENCOUNTER — Other Ambulatory Visit: Payer: Self-pay

## 2013-10-01 MED ORDER — METOPROLOL SUCCINATE ER 200 MG PO TB24: 200.0000 mg | ORAL_TABLET | Freq: Every day | ORAL | Status: DC

## 2013-10-07 ENCOUNTER — Other Ambulatory Visit (HOSPITAL_COMMUNITY): Payer: 59

## 2013-10-20 ENCOUNTER — Other Ambulatory Visit: Payer: Self-pay | Admitting: Family Medicine

## 2013-10-26 NOTE — Assessment & Plan Note (Signed)
Fairly good control despite weight gain, pt will work aggressively on weight loss for improved overall health

## 2013-10-26 NOTE — Assessment & Plan Note (Signed)
Managed by endo Updated labs to be obtained from treating Md Patient advised to reduce carb and sweets, commit to regular physical activity, take meds as prescribed, test blood as directed, and attempt to lose weight, to improve blood sugar control.

## 2013-10-26 NOTE — Assessment & Plan Note (Signed)
Controlled, no change in medication  

## 2013-10-26 NOTE — Assessment & Plan Note (Signed)
Need updated lab Hyperlipidemia:Low fat diet discussed and encouraged.  Ptstill resistant to statin therapy will re address at next visit

## 2013-10-26 NOTE — Progress Notes (Signed)
   Subjective:    Patient ID: Tammy Hurley, female    DOB: [birth year], 68 y.o.   MRN: [id number]  HPI The PT is here for follow up and re-evaluation of chronic medical conditions, medication management and review of any available recent lab and radiology data.  Preventive health is updated, specifically  Cancer screening and Immunization.   Questions or concerns regarding consultations or procedures which the PT has had in the interim are  Addressed.Need to obtain recent eye exam and endo visits The PT denies any adverse reactions to current medications since the last visit.  There are no new concerns.  There are no specific complaints . Does report insufficient commitment to  Healthy lifestyle, is concerned about weight gain and denies symptoms of uncontrolled blood sugar      Review of Systems See HPI Denies recent fever or chills. Denies sinus pressure, nasal congestion, ear pain or sore throat. Denies chest congestion, productive cough or wheezing. Denies chest pains, palpitations and leg swelling Denies abdominal pain, nausea, vomiting,diarrhea or constipation.   Denies dysuria, frequency, hesitancy or incontinence. Denies uncontrolled joint pain, swelling and limitation in mobility. Denies headaches, seizures, numbness, or tingling. Denies depression, anxiety or insomnia. Denies skin break down or rash.        Objective:   Physical Exam BP 130/82  Pulse 66  Resp 18  Wt 252 lb (114.306 kg)  SpO2 99% Patient alert and oriented and in no cardiopulmonary distress.  HEENT: No facial asymmetry, EOMI,   oropharynx pink and moist.  Neck supple no JVD, no mass.  Chest: Clear to auscultation bilaterally.  CVS: S1, S2 no murmurs, no S3.  ABD: Soft non tender.   Ext: No edema  MS: Adequate though reduced  ROM spine, shoulders, hips and knees.  Skin: Intact, no ulcerations or rash noted.  Psych: Good eye contact, normal affect. Memory intact not anxious or depressed  appearing.  CNS: CN 2-12 intact, power,  normal throughout.no focal deficits noted.        Assessment & Plan:  Type 2 diabetes with nephropathy Managed by endo Updated labs to be obtained from treating Md Patient advised to reduce carb and sweets, commit to regular physical activity, take meds as prescribed, test blood as directed, and attempt to lose weight, to improve blood sugar control.   OBESITY Deteriorated. Patient re-educated about  the importance of commitment to a  minimum of 150 minutes of exercise per week. The importance of healthy food choices with portion control discussed. Encouraged to start a food diary, count calories and to consider  joining a support group. Sample diet sheets offered. Goals set by the patient for the next several months.     Hyperlipidemia LDL goal <100 Need updated lab Hyperlipidemia:Low fat diet discussed and encouraged.  Ptstill resistant to statin therapy will re address at next visit  OSTEOARTHRITIS Fairly good control despite weight gain, pt will work aggressively on weight loss for improved overall health  OTHER OSTEOPOROSIS Updated radiologic data needed, dexa ordered. Pt to ensure her daily calcium intake is at least 1000mg   GERD Controlled, no change in medication   HYPERTENSION DASH diet and commitment to daily physical activity for a minimum of 30 minutes discussed and encouraged, as a part of hypertension management. The importance of attaining a healthy weight is also discussed. Controlled, no change in medication

## 2013-10-26 NOTE — Assessment & Plan Note (Signed)
Deteriorated. Patient re-educated about  the importance of commitment to a  minimum of 150 minutes of exercise per week. The importance of healthy food choices with portion control discussed. Encouraged to start a food diary, count calories and to consider  joining a support group. Sample diet sheets offered. Goals set by the patient for the next several months.    

## 2013-10-26 NOTE — Assessment & Plan Note (Signed)
Updated radiologic data needed, dexa ordered. Pt to ensure her daily calcium intake is at least 1000mg 

## 2013-10-26 NOTE — Assessment & Plan Note (Addendum)
DASH diet and commitment to daily physical activity for a minimum of 30 minutes discussed and encouraged, as a part of hypertension management. The importance of attaining a healthy weight is also discussed. Controlled, no change in medication   

## 2013-11-29 ENCOUNTER — Other Ambulatory Visit: Payer: Self-pay | Admitting: Family Medicine

## 2013-12-23 ENCOUNTER — Telehealth: Payer: Self-pay | Admitting: Family Medicine

## 2013-12-23 ENCOUNTER — Other Ambulatory Visit: Payer: Self-pay

## 2013-12-23 MED ORDER — PREDNISONE (PAK) 5 MG PO TABS
ORAL_TABLET | ORAL | Status: DC
Start: 2013-12-23 — End: 2014-04-29

## 2013-12-23 NOTE — Telephone Encounter (Signed)
pls send in pred 5 mg dose pack #21 only and let her know

## 2013-12-23 NOTE — Telephone Encounter (Signed)
Ate steak Sat night and woke up this am with gout in her left foot. Can't hardly walk on it, painful and swollen. Has appt tomorrow but can't wait for meds. Wants prednisone called in to [place]. Please advise

## 2013-12-23 NOTE — Telephone Encounter (Signed)
Patient aware and med sent  

## 2013-12-24 ENCOUNTER — Ambulatory Visit (INDEPENDENT_AMBULATORY_CARE_PROVIDER_SITE_OTHER): Payer: 59 | Admitting: Family Medicine

## 2013-12-24 ENCOUNTER — Encounter (INDEPENDENT_AMBULATORY_CARE_PROVIDER_SITE_OTHER): Payer: Self-pay

## 2013-12-24 ENCOUNTER — Encounter: Payer: Self-pay | Admitting: Family Medicine

## 2013-12-24 VITALS — BP 120/72 | HR 77 | Resp 16 | Ht 67.0 in | Wt 248.4 lb

## 2013-12-24 DIAGNOSIS — N750 Cyst of Bartholin's gland: Secondary | ICD-10-CM | POA: Insufficient documentation

## 2013-12-24 DIAGNOSIS — I10 Essential (primary) hypertension: Secondary | ICD-10-CM

## 2013-12-24 DIAGNOSIS — N058 Unspecified nephritic syndrome with other morphologic changes: Secondary | ICD-10-CM

## 2013-12-24 DIAGNOSIS — M109 Gout, unspecified: Secondary | ICD-10-CM

## 2013-12-24 DIAGNOSIS — E1121 Type 2 diabetes mellitus with diabetic nephropathy: Secondary | ICD-10-CM

## 2013-12-24 DIAGNOSIS — E1129 Type 2 diabetes mellitus with other diabetic kidney complication: Secondary | ICD-10-CM

## 2013-12-24 DIAGNOSIS — M10072 Idiopathic gout, left ankle and foot: Secondary | ICD-10-CM

## 2013-12-24 NOTE — Assessment & Plan Note (Addendum)
Improving with medication started one day ago, she will complete course of treatment prescribed

## 2013-12-24 NOTE — Patient Instructions (Signed)
Return as before in October, but will do annual wellness, since you are getting gyne eval soon  Happy that your gout is improved  You are referred to gyne in [place] for gyne exam  Pls get flu vaccine

## 2013-12-24 NOTE — Progress Notes (Signed)
   Subjective:    Patient ID: Tammy Hurley, female    DOB: Nov 27, 1945, 68 y.o.   MRN: [id number]  HPI Acute left foot pain one day ago , good response to prednisone, feels as though red meat was the trigger that she had on the weekend  Boil in groin  For past 4 days, no fever or chillls, no drainage Otherwise generally doing well, no respiratory symptoms , but elects to defer flu vaccine until later in the year Denies symptoms of uncontrolled diabetes, and follows regularly with endo   Review of Systems See HPI Denies recent fever or chills. Denies sinus pressure, nasal congestion, ear pain or sore throat. Denies chest congestion, productive cough or wheezing. Denies chest pains, palpitations and leg swelling Denies abdominal pain, nausea, vomiting,diarrhea or constipation.   Denies dysuria, frequency, hesitancy or incontinence.        Objective:   Physical Exam .bvs Patient alert and oriented and in no cardiopulmonary distress.  HEENT: No facial asymmetry, EOMI,   oropharynx pink and moist.  Neck supple no JVD, no mass.  Chest: Clear to auscultation bilaterally.  CVS: S1, S2 no murmurs, no S3.Regular rate.  ABD: Soft non tender.  Pelvic: External exam only done. Non tender cyst at 5 o clock position of left labia majora, external exam otherwise normalk Ext: No edema  MS: Adequate though reduced  ROM spine,normal in  shoulders, hips and knees.      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

## 2013-12-24 NOTE — Assessment & Plan Note (Addendum)
Controlled, no change in medication  

## 2013-12-29 NOTE — Assessment & Plan Note (Signed)
Well controlled, followed by endo in [place] Need to get updated lab data and eye exam

## 2013-12-29 NOTE — Assessment & Plan Note (Signed)
enlarging cyst in left labia majora at 5 o clock, not infected, refer to gyne for definitive management

## 2014-01-27 ENCOUNTER — Encounter: Payer: 59 | Admitting: Family Medicine

## 2014-02-20 ENCOUNTER — Other Ambulatory Visit: Payer: Self-pay | Admitting: Family Medicine

## 2014-02-20 DIAGNOSIS — Z1231 Encounter for screening mammogram for malignant neoplasm of breast: Secondary | ICD-10-CM

## 2014-03-04 ENCOUNTER — Other Ambulatory Visit: Payer: Self-pay | Admitting: Family Medicine

## 2014-03-05 ENCOUNTER — Telehealth: Payer: Self-pay

## 2014-03-05 MED ORDER — DIPHENOXYLATE-ATROPINE 2.5-0.025 MG PO TABS: 1.0000 | ORAL_TABLET | Freq: Four times a day (QID) | ORAL | Status: DC | PRN

## 2014-03-05 NOTE — Telephone Encounter (Signed)
Vomiting No.    Recommended treatment Hydration is important Fluids small frequent amounts as tolerated Good hygiene reduces transmission among family members Review Brat diet  Zofran 4 mg 1 tablet daily as needed for nausea and vomiting no more than 6 tablets   DiarrheaYes.   started this am, no appetitie no nausea. Just loose watery diarrhea. Has been about 3 times in the past hour. Advised BRAT diet and increase fluids and call back if gets worse for appt  Recommended treatment  Imodium OTC  Can also offer Lomotil 1 tablet 4 times daily as needed no more than 10 tablets Good hygiene reduces transmission among family members Review Brat Diet  If patient starts to feel light headed or not passing much urine or becoming dehydrated will need to go to emergency room for IV hydration  Please call office if symptoms worsen or do not improve after 2-3 days

## 2014-03-08 ENCOUNTER — Other Ambulatory Visit: Payer: Self-pay | Admitting: Family Medicine

## 2014-03-31 ENCOUNTER — Ambulatory Visit (HOSPITAL_COMMUNITY): Payer: 59

## 2014-04-26 ENCOUNTER — Other Ambulatory Visit: Payer: Self-pay | Admitting: Family Medicine

## 2014-04-29 ENCOUNTER — Ambulatory Visit (INDEPENDENT_AMBULATORY_CARE_PROVIDER_SITE_OTHER): Payer: Medicare Other

## 2014-04-29 ENCOUNTER — Encounter: Payer: Self-pay | Admitting: Family Medicine

## 2014-04-29 ENCOUNTER — Ambulatory Visit (INDEPENDENT_AMBULATORY_CARE_PROVIDER_SITE_OTHER): Payer: BC Managed Care – PPO | Admitting: Family Medicine

## 2014-04-29 VITALS — BP 138/80 | HR 64 | Resp 16 | Ht 67.0 in | Wt 254.0 lb

## 2014-04-29 DIAGNOSIS — Z1239 Encounter for other screening for malignant neoplasm of breast: Secondary | ICD-10-CM

## 2014-04-29 DIAGNOSIS — Z23 Encounter for immunization: Secondary | ICD-10-CM

## 2014-04-29 DIAGNOSIS — Z13820 Encounter for screening for osteoporosis: Secondary | ICD-10-CM

## 2014-04-29 DIAGNOSIS — E785 Hyperlipidemia, unspecified: Secondary | ICD-10-CM

## 2014-04-29 DIAGNOSIS — I10 Essential (primary) hypertension: Secondary | ICD-10-CM

## 2014-04-29 DIAGNOSIS — M5440 Lumbago with sciatica, unspecified side: Secondary | ICD-10-CM

## 2014-04-29 DIAGNOSIS — IMO0001 Reserved for inherently not codable concepts without codable children: Secondary | ICD-10-CM

## 2014-04-29 DIAGNOSIS — E1121 Type 2 diabetes mellitus with diabetic nephropathy: Secondary | ICD-10-CM

## 2014-04-29 LAB — HEMOGLOBIN A1C: A1c: 6.6

## 2014-04-29 MED ORDER — TRIAMTERENE-HCTZ 75-50 MG PO TABS: 1.0000 | ORAL_TABLET | Freq: Every day | ORAL | Status: DC

## 2014-04-29 MED ORDER — POTASSIUM CHLORIDE CRYS ER 20 MEQ PO TBCR: 20.0000 meq | EXTENDED_RELEASE_TABLET | Freq: Two times a day (BID) | ORAL | Status: DC

## 2014-04-29 MED ORDER — TOPROL XL 200 MG PO TB24: 200.0000 mg | ORAL_TABLET | Freq: Every day | ORAL | Status: DC

## 2014-04-29 NOTE — Assessment & Plan Note (Addendum)
Controlled, no change in medication Patient advised to reduce carb and sweets, commit to regular physical activity, take meds as prescribed, test blood as directed, and attempt to lose weight, to improve blood sugar control. Treated by endo

## 2014-04-29 NOTE — Patient Instructions (Signed)
Annual wellness in 3.5 month, call if you need me before  Flu vaccine today  You are referred for eye exam, mammogram and  Bone density test  We will contact your endocrinologist re recent HBa1C and urine test after your visit in the next 1 to 2 weeks  Tylenol 650 mg twice daily is safe to take for arthritic pain  All the best for 2016!  Ninety day supplies of medication (3) are sent in

## 2014-05-04 NOTE — Assessment & Plan Note (Signed)
Unchanged , advised to tylenol in place of NSAIDS

## 2014-05-04 NOTE — Assessment & Plan Note (Signed)
Controlled, no change in medication DASH diet and commitment to daily physical activity for a minimum of 30 minutes discussed and encouraged, as a part of hypertension management. The importance of attaining a healthy weight is also discussed.  

## 2014-05-04 NOTE — Progress Notes (Signed)
   Subjective:    Patient ID: Tammy Hurley, female    DOB: 22-Oct-1945, 69 y.o.   MRN: [id number]  HPI The PT is here for follow up and re-evaluation of chronic medical conditions, medication management and review of any available recent lab and radiology data.  Preventive health is updated, specifically  Cancer screening and Immunization.  Needs mammogram and eye exam Was evaluated by gyne since last visit, no significant pathology in area of concern, sees endo again in the near future The PT denies any adverse reactions to current medications since the last visit.  chronic back and hip pain wants safe alternative to NSAID for pain management , will start with tylenol Denies polyuria, polydipsia, blurred vision , or hypoglycemic episodes. s       Review of Systems See HPI Denies recent fever or chills. Denies sinus pressure, nasal congestion, ear pain or sore throat. Denies chest congestion, productive cough or wheezing. Denies chest pains, palpitations and leg swelling Denies abdominal pain, nausea, vomiting,diarrhea or constipation.   Denies dysuria, frequency, hesitancy or incontinence. Denies headaches, seizures, numbness, or tingling. Denies depression, anxiety or insomnia. Denies skin break down or rash.        Objective:   Physical Exam  BP 138/80 mmHg  Pulse 64  Resp 16  Ht 5\' 7"  (1.702 m)  Wt 254 lb (115.214 kg)  BMI 39.77 kg/m2  SpO2 96% Patient alert and oriented and in no cardiopulmonary distress.  HEENT: No facial asymmetry, EOMI,   oropharynx pink and moist.  Neck supple no JVD, no mass.  Chest: Clear to auscultation bilaterally.  CVS: S1, S2 no murmurs, no S3.Regular rate.  ABD: Soft non tender.   Ext: No edema  MS: decreased  ROM spine, shoulders, hips and knees.  Skin: Intact, no ulcerations or rash noted.  Psych: Good eye contact, normal affect. Memory intact not anxious or depressed appearing.  CNS: CN 2-12 intact, power,  normal  throughout.no focal deficits noted.       Assessment & Plan:  Type 2 diabetes with nephropathy Controlled, no change in medication Patient advised to reduce carb and sweets, commit to regular physical activity, take meds as prescribed, test blood as directed, and attempt to lose weight, to improve blood sugar control. Treated by endo  Essential hypertension Controlled, no change in medication DASH diet and commitment to daily physical activity for a minimum of 30 minutes discussed and encouraged, as a part of hypertension management. The importance of attaining a healthy weight is also discussed.   Back pain with sciatica Unchanged , advised to tylenol in place of NSAIDS   Hyperlipidemia LDL goal <100 Hyperlipidemia:Low fat diet discussed and encouraged.  Updated lab needed at/ before next visit.   Obesity, Class II, BMI 35.0-39.9, with comorbidity (see actual BMI) Deteriorated. Patient re-educated about  the importance of commitment to a  minimum of 150 minutes of exercise per week. The importance of healthy food choices with portion control discussed. Encouraged to start a food diary, count calories and to consider  joining a support group. Sample diet sheets offered. Goals set by the patient for the next several months.     Need for prophylactic vaccination and inoculation against influenza Vaccine administered at visit.

## 2014-05-04 NOTE — Assessment & Plan Note (Signed)
Vaccine administered at visit.  

## 2014-05-04 NOTE — Assessment & Plan Note (Signed)
Hyperlipidemia:Low fat diet discussed and encouraged.  Updated lab needed at/ before next visit.  

## 2014-05-04 NOTE — Assessment & Plan Note (Signed)
Deteriorated. Patient re-educated about  the importance of commitment to a  minimum of 150 minutes of exercise per week. The importance of healthy food choices with portion control discussed. Encouraged to start a food diary, count calories and to consider  joining a support group. Sample diet sheets offered. Goals set by the patient for the next several months.    

## 2014-05-05 ENCOUNTER — Inpatient Hospital Stay (HOSPITAL_COMMUNITY): Admission: RE | Admit: 2014-05-05 | Payer: BC Managed Care – PPO | Source: Ambulatory Visit

## 2014-05-26 ENCOUNTER — Other Ambulatory Visit: Payer: Self-pay

## 2014-08-06 ENCOUNTER — Encounter: Payer: Self-pay | Admitting: Family Medicine

## 2014-08-06 ENCOUNTER — Ambulatory Visit (INDEPENDENT_AMBULATORY_CARE_PROVIDER_SITE_OTHER): Payer: Medicare Other | Admitting: Family Medicine

## 2014-08-06 VITALS — BP 124/82 | HR 68 | Resp 16 | Ht 67.5 in | Wt 255.4 lb

## 2014-08-06 DIAGNOSIS — I10 Essential (primary) hypertension: Secondary | ICD-10-CM

## 2014-08-06 DIAGNOSIS — M25569 Pain in unspecified knee: Secondary | ICD-10-CM | POA: Insufficient documentation

## 2014-08-06 DIAGNOSIS — M810 Age-related osteoporosis without current pathological fracture: Secondary | ICD-10-CM

## 2014-08-06 DIAGNOSIS — E785 Hyperlipidemia, unspecified: Secondary | ICD-10-CM

## 2014-08-06 DIAGNOSIS — M25562 Pain in left knee: Secondary | ICD-10-CM | POA: Diagnosis not present

## 2014-08-06 DIAGNOSIS — E1121 Type 2 diabetes mellitus with diabetic nephropathy: Secondary | ICD-10-CM | POA: Diagnosis not present

## 2014-08-06 DIAGNOSIS — IMO0001 Reserved for inherently not codable concepts without codable children: Secondary | ICD-10-CM

## 2014-08-06 MED ORDER — PREDNISONE (PAK) 5 MG PO TABS: 5.0000 mg | ORAL_TABLET | ORAL | Status: DC

## 2014-08-06 MED ORDER — METHYLPREDNISOLONE ACETATE 80 MG/ML IJ SUSP
80.0000 mg | Freq: Once | INTRAMUSCULAR | Status: AC
  Administered 2014-08-06: 80 mg via INTRAMUSCULAR

## 2014-08-06 NOTE — Patient Instructions (Signed)
Annual wellness as before  You will get Depo medrol in office today, and 6 day course of prednisone prescribed to help with pain and swelling in left knee  You are referred to orthopedic for morning appt next week if possible (Dr Percell Miller)  You have osteoarthritis in the knee  Fasting lipid, cmp and EGFr, hBA1C, TSH CBC and vit D as soon as possible, microalb today, the results will be sent to you to take to your endo appt later this month  Thanks for choosing [facility], we consider it a privelige to serve you.   Blood pressure is good

## 2014-08-07 LAB — MICROALBUMIN / CREATININE URINE RATIO
Creatinine, Urine: 199.1 mg/dL
MICROALB UR: 3.5 mg/dL — AB (ref <2.0)
Microalb Creat Ratio: 17.6 mg/g (ref 0.0–30.0)

## 2014-08-17 NOTE — Assessment & Plan Note (Signed)
Depo medrol in office Im followed by prednisone orally and ortho eval

## 2014-08-19 ENCOUNTER — Encounter: Payer: Self-pay | Admitting: Family Medicine

## 2014-08-19 ENCOUNTER — Encounter: Payer: BC Managed Care – PPO | Admitting: Family Medicine

## 2014-10-03 NOTE — Assessment & Plan Note (Signed)
Followed by endo, updated labs past due Re educate re need to follow diet and exercise program and keep follow up appts as scheduled  Labs this week will be sent to her endo who she sees later this monht

## 2014-10-03 NOTE — Assessment & Plan Note (Signed)
Controlled, no change in medication DASH diet and commitment to daily physical activity for a minimum of 30 minutes discussed and encouraged, as a part of hypertension management. The importance of attaining a healthy weight is also discussed.  BP/Weight 08/06/2014 04/29/2014 12/24/2013 09/30/2013 05/13/2013 04/11/2013 01/26/1280  Systolic BP 188 677 373 668 159 470 761  Diastolic BP 82 80 72 82 80 85 76  Wt. (Lbs) 255.4 254 248.4 252 - 240 252  BMI 39.39 39.77 38.9 39.46 - 36.5 39.46

## 2014-10-03 NOTE — Assessment & Plan Note (Signed)
Hyperlipidemia:Low fat diet discussed and encouraged.   Lipid Panel  Lab Results  Component Value Date   CHOL 208* 07/11/2012   HDL 40 07/11/2012   LDLCALC 99 07/11/2012   LDLDIRECT 79 10/26/2006   TRIG 343* 07/11/2012   CHOLHDL 5.2 07/11/2012      Updated lab past due.

## 2014-10-03 NOTE — Assessment & Plan Note (Signed)
Deteriorated. Patient re-educated about  the importance of commitment to a  minimum of 150 minutes of exercise per week.  The importance of healthy food choices with portion control discussed. Encouraged to start a food diary, count calories and to consider  joining a support group. Sample diet sheets offered. Goals set by the patient for the next several months.   Weight /BMI 08/06/2014 04/29/2014 12/24/2013  WEIGHT 255 lb 6.4 oz 254 lb 248 lb 6.4 oz  HEIGHT 5' 7.5" 5\' 7"  5\' 7"   BMI 39.39 kg/m2 39.77 kg/m2 38.9 kg/m2    Current exercise per week 60 minutes.

## 2014-10-03 NOTE — Progress Notes (Signed)
Subjective:    Patient ID: Tammy Hurley, female    DOB: 1946-02-05, 69 y.o.   MRN: [id number]  HPI Acute knee pain with a pop and instability, of left knee , has and swelling and arthritic probs in the affected joint in th past Denies polyuria, polydipsia, blurred vision , or hypoglycemic episodes. Not managing her blood sugars as aggressively as she knows that she should but plans to work on this  Review of Systems See HPI Denies recent fever or chills. Denies sinus pressure, nasal congestion, ear pain or sore throat. Denies chest congestion, productive cough or wheezing. Denies chest pains, palpitations and leg swelling Denies abdominal pain, nausea, vomiting,diarrhea or constipation.   Denies dysuria, frequency, hesitancy or incontinence. Denies headaches, seizures, numbness, or tingling. Denies depression, anxiety or insomnia. Denies skin break down or rash.        Objective:   Physical Exam BP 124/82 mmHg  Pulse 68  Resp 16  Ht 5' 7.5" (1.715 m)  Wt 255 lb 6.4 oz (115.849 kg)  BMI 39.39 kg/m2  SpO2 96% Patient alert and oriented and in no cardiopulmonary distress.Pt in apian  HEENT: No facial asymmetry, EOMI,   oropharynx pink and moist.  Neck supple no JVD, no mass.  Chest: Clear to auscultation bilaterally.  CVS: S1, S2 no murmurs, no S3.Regular rate.  ABD: Soft non tender.   Ext: No edema   though reduced  ROM spine, shoulders, hips and knees.Marked reduced ROM left knee which is swollen , warm and tender  Skin: Intact, no ulcerations or rash noted.  Psych: Good eye contact, normal affect. Memory intact not anxious or depressed appearing.  CNS: CN 2-12 intact, power,  normal throughout.no focal deficits noted.        Assessment & Plan:  Knee pain, acute Depo medrol in office Im followed by prednisone orally and ortho eval  Hyperlipidemia LDL goal <100 Hyperlipidemia:Low fat diet discussed and encouraged.   Lipid Panel  Lab Results    Component Value Date   CHOL 208* 07/11/2012   HDL 40 07/11/2012   LDLCALC 99 07/11/2012   LDLDIRECT 79 10/26/2006   TRIG 343* 07/11/2012   CHOLHDL 5.2 07/11/2012      Updated lab past due.   Obesity, Class II, BMI 35.0-39.9, with comorbidity (see actual BMI) Deteriorated. Patient re-educated about  the importance of commitment to a  minimum of 150 minutes of exercise per week.  The importance of healthy food choices with portion control discussed. Encouraged to start a food diary, count calories and to consider  joining a support group. Sample diet sheets offered. Goals set by the patient for the next several months.   Weight /BMI 08/06/2014 04/29/2014 12/24/2013  WEIGHT 255 lb 6.4 oz 254 lb 248 lb 6.4 oz  HEIGHT 5' 7.5" 5\' 7"  5\' 7"   BMI 39.39 kg/m2 39.77 kg/m2 38.9 kg/m2    Current exercise per week 60 minutes.   Essential hypertension Controlled, no change in medication DASH diet and commitment to daily physical activity for a minimum of 30 minutes discussed and encouraged, as a part of hypertension management. The importance of attaining a healthy weight is also discussed.  BP/Weight 08/06/2014 04/29/2014 12/24/2013 09/30/2013 05/13/2013 04/11/2013 37/04/624  Systolic BP 948 546 270 350 093 818 299  Diastolic BP 82 80 72 82 80 85 76  Wt. (Lbs) 255.4 254 248.4 252 - 240 252  BMI 39.39 39.77 38.9 39.46 - 36.5 39.46  Type 2 diabetes with nephropathy Followed by endo, updated labs past due Re educate re need to follow diet and exercise program and keep follow up appts as scheduled  Labs this week will be sent to her endo who she sees later this monht

## 2014-10-10 LAB — LDL CHOLESTEROL, DIRECT: LDL: 97

## 2014-12-18 ENCOUNTER — Other Ambulatory Visit: Payer: Self-pay | Admitting: Family Medicine

## 2014-12-22 ENCOUNTER — Other Ambulatory Visit: Payer: Self-pay | Admitting: Family Medicine

## 2014-12-23 ENCOUNTER — Other Ambulatory Visit: Payer: Self-pay

## 2014-12-23 MED ORDER — TOPROL XL 200 MG PO TB24: 200.0000 mg | ORAL_TABLET | Freq: Every day | ORAL | Status: DC

## 2015-01-22 ENCOUNTER — Ambulatory Visit (INDEPENDENT_AMBULATORY_CARE_PROVIDER_SITE_OTHER): Payer: Medicare Other | Admitting: Family Medicine

## 2015-01-22 ENCOUNTER — Other Ambulatory Visit: Payer: Self-pay

## 2015-01-22 ENCOUNTER — Encounter: Payer: Self-pay | Admitting: Family Medicine

## 2015-01-22 VITALS — BP 130/82 | HR 63 | Resp 16 | Ht 68.0 in | Wt 255.1 lb

## 2015-01-22 DIAGNOSIS — H9190 Unspecified hearing loss, unspecified ear: Secondary | ICD-10-CM | POA: Insufficient documentation

## 2015-01-22 DIAGNOSIS — N189 Chronic kidney disease, unspecified: Secondary | ICD-10-CM

## 2015-01-22 DIAGNOSIS — H8110 Benign paroxysmal vertigo, unspecified ear: Secondary | ICD-10-CM

## 2015-01-22 DIAGNOSIS — Z0000 Encounter for general adult medical examination without abnormal findings: Secondary | ICD-10-CM

## 2015-01-22 DIAGNOSIS — E1122 Type 2 diabetes mellitus with diabetic chronic kidney disease: Secondary | ICD-10-CM

## 2015-01-22 DIAGNOSIS — Z1231 Encounter for screening mammogram for malignant neoplasm of breast: Secondary | ICD-10-CM

## 2015-01-22 LAB — HEMOGLOBIN A1C: A1c: 6.9

## 2015-01-22 MED ORDER — MECLIZINE HCL 50 MG PO TABS: 25.0000 mg | ORAL_TABLET | Freq: Three times a day (TID) | ORAL | Status: DC | PRN

## 2015-01-22 NOTE — Patient Instructions (Signed)
Annual physical exam in February, call if you need me before   Please reconsider  Vaccines you need them    You are referred to ENT for evaluation of hearing and recurrent vertigo  Meclizine is sent in  Mammogram needed, you are referred   Please work on good  health habits so that your health will improve. 1. Commitment to daily physical activity for 30 to 60  minutes, if you are able to do this.  2. Commitment to wise food choices. Aim for half of your  food intake to be vegetable and fruit, one quarter starchy foods, and one quarter protein. Try to eat on a regular schedule  3 meals per day, snacking between meals should be limited to vegetables or fruits or small portions of nuts. 64 ounces of water per day is generally recommended, unless you have specific health conditions, like heart failure or kidney failure where you will need to limit fluid intake.  3. Commitment to sufficient and a  good quality of physical and mental rest daily, generally between 6 to 8 hours per day.  WITH PERSISTANCE AND PERSEVERANCE, THE IMPOSSIBLE , BECOMES THE NORM!

## 2015-01-22 NOTE — Assessment & Plan Note (Signed)

## 2015-01-22 NOTE — Progress Notes (Signed)
Subjective:    Patient ID: Tammy Hurley, female    DOB: [birth year], 69 y.o.   MRN: [id number]  HPI Preventive Screening-Counseling & Management   Patient present here today for a Medicare annual wellness visit.   Current Problems (verified)   Medications Prior to Visit Allergies (verified)   PAST HISTORY  Family History (verified)    Social History Divorced for 10 years, 3 children, 2 grandchildren and 3 great grands, semi retired from [employer]. Works part time Security      Risk Factors  Current exercise habits: Exercises on her job. Walks everyday    Dietary issues discussed: heart healthy low carb, low fat   Cardiac risk factors: Type 2 diabetes   Depression Screen  (Note: if answer to either of the following is "Yes", a more complete depression screening is indicated)   Over the past two weeks, have you felt down, depressed or hopeless? No  Over the past two weeks, have you felt little interest or pleasure in doing things? No  Have you lost interest or pleasure in daily life? No  Do you often feel hopeless? No  Do you cry easily over simple problems? No   Activities of Daily Living  In your present state of health, do you have any difficulty performing the following activities?  Driving?: No Managing money?: No Feeding yourself?:No Getting from bed to chair?:No Climbing a flight of stairs?:No Preparing food and eating?:No Bathing or showering?:No Getting dressed?:No Getting to the toilet?:No Using the toilet?:No Moving around from place to place?: No  Fall Risk Assessment In the past year have you fallen or had a near fall?:No Are you currently taking any medications that make you dizzy   Hearing Difficulties: No Do you often ask people to speak up or repeat themselves?:No Do you experience ringing or noises in your ears?:No Do you have difficulty understanding soft or whispered voices?:No  Cognitive Testing  Alert? Yes Normal Appearance?Yes    Oriented to person? Yes Place? Yes  Time? Yes  Displays appropriate judgment?Yes  Can read the correct time from a watch face? yes Are you having problems remembering things?No  Advanced Directives have been discussed with the patient?Yes, no living will.     List the Names of Other Physician/Practitioners you currently use:  Dr Livia Snellen (Endo )  Indicate any recent Medical Services you may have received from other than [facility] providers in the past year (date may be approximate).   Assessment:    Annual Wellness Exam   Plan:    Medicare Attestation  I have personally reviewed:  The patient's medical and social history  Their use of alcohol, tobacco or illicit drugs  Their current medications and supplements  The patient's functional ability including ADLs,fall risks, home safety risks, cognitive, and hearing and visual impairment  Diet and physical activities  Evidence for depression or mood disorders  The patient's weight, height, BMI, and visual acuity have been recorded in the chart. I have made referrals, counseling, and provided education to the patient based on review of the above and I have provided the patient with a written personalized care plan for preventive services.      Review of Systems     Objective:   Physical Exam BP 130/82 mmHg  Pulse 63  Resp 16  Ht 5\' 8"  (1.727 m)  Wt 255 lb 1.9 oz (115.722 kg)  BMI 38.80 kg/m2  SpO2 97%        Assessment & Plan:  Medicare  annual wellness visit, subsequent Annual exam as documented. Counseling done  re healthy lifestyle involving commitment to 150 minutes exercise per week, heart healthy diet, and attaining healthy weight.The importance of adequate sleep also discussed. Regular seat belt use and home safety, is also discussed. Changes in health habits are decided on by the patient with goals and time frames  set for achieving them. Immunization and cancer screening needs are specifically addressed at this  visit.   Hearing loss ENT to eval and treat

## 2015-01-22 NOTE — Assessment & Plan Note (Signed)
ENT to eval and treat

## 2015-01-27 ENCOUNTER — Telehealth: Payer: Self-pay | Admitting: *Deleted

## 2015-01-27 NOTE — Telephone Encounter (Signed)
States since she was here she has just felt "yucky" No appetite and sometimes nauseated with a bad taste in her mouth. Can feel a small dime size knot on her right side that feels sore. Does wear a tight belt and her backbrace so thinks it could be from that. Does she need any labs done, hpylori testing, etc?

## 2015-01-27 NOTE — Telephone Encounter (Signed)
Called patient and left message for them to return call at the office   

## 2015-01-27 NOTE — Telephone Encounter (Signed)
Patient called stating something is going on with her stomach and she was seen here last Thursday and her stomach has been bothering her since she was here. Please advise

## 2015-01-27 NOTE — Telephone Encounter (Signed)
Both appendix and gall bladder are removed from history, so ,encourage fluids, offer zofran for nausea 4 mg one daily as needed # 7 only, pls send if she wants this ensure her blood sugar is good, when recently here she stated it was not as good as it had been and shje ahd stopped taking her levemir, if she has sinus drainage OTC claritin daily will help, that sometimes causes nausea, s said that she would resume.if symptoms persist or worsen she wuill need clinical evaluation

## 2015-01-28 NOTE — Telephone Encounter (Signed)
Patient feels better today and will call back if she feels like she needs the med for nausea

## 2015-02-11 ENCOUNTER — Telehealth: Payer: Self-pay

## 2015-02-11 DIAGNOSIS — I10 Essential (primary) hypertension: Secondary | ICD-10-CM

## 2015-02-11 DIAGNOSIS — Z1159 Encounter for screening for other viral diseases: Secondary | ICD-10-CM

## 2015-02-11 DIAGNOSIS — E785 Hyperlipidemia, unspecified: Secondary | ICD-10-CM

## 2015-02-11 DIAGNOSIS — E1121 Type 2 diabetes mellitus with diabetic nephropathy: Secondary | ICD-10-CM

## 2015-02-11 NOTE — Telephone Encounter (Signed)
Labs ordered.

## 2015-02-13 LAB — COMPLETE METABOLIC PANEL WITH GFR
ALBUMIN: 4 g/dL (ref 3.6–5.1)
ALK PHOS: 83 U/L (ref 33–130)
ALT: 21 U/L (ref 6–29)
AST: 19 U/L (ref 10–35)
BILIRUBIN TOTAL: 0.4 mg/dL (ref 0.2–1.2)
BUN: 23 mg/dL (ref 7–25)
CO2: 25 mmol/L (ref 20–31)
Calcium: 9.4 mg/dL (ref 8.6–10.4)
Chloride: 103 mmol/L (ref 98–110)
Creat: 1.45 mg/dL — ABNORMAL HIGH (ref 0.60–0.93)
GFR, EST AFRICAN AMERICAN: 42 mL/min — AB (ref >=60)
GFR, EST NON AFRICAN AMERICAN: 36 mL/min — AB (ref >=60)
GLUCOSE: 140 mg/dL — AB (ref 65–99)
POTASSIUM: 3.9 mmol/L (ref 3.5–5.3)
SODIUM: 139 mmol/L (ref 135–146)
TOTAL PROTEIN: 6.8 g/dL (ref 6.1–8.1)

## 2015-02-13 LAB — LIPID PANEL
Cholesterol: 174 mg/dL (ref 125–200)
HDL: 35 mg/dL — ABNORMAL LOW (ref >=46)
LDL CALC: 78 mg/dL (ref <130)
TRIGLYCERIDES: 305 mg/dL — AB (ref <150)
Total CHOL/HDL Ratio: 5 Ratio (ref <=5.0)
VLDL: 61 mg/dL — AB (ref <30)

## 2015-02-13 LAB — CBC WITH DIFFERENTIAL/PLATELET
BASOS ABS: 0.1 10*3/uL (ref 0.0–0.1)
BASOS PCT: 1 % (ref 0–1)
EOS ABS: 0.2 10*3/uL (ref 0.0–0.7)
Eosinophils Relative: 2 % (ref 0–5)
HCT: 37.4 % (ref 36.0–46.0)
HEMOGLOBIN: 12.5 g/dL (ref 12.0–15.0)
Lymphocytes Relative: 39 % (ref 12–46)
Lymphs Abs: 3.7 10*3/uL (ref 0.7–4.0)
MCH: 29.6 pg (ref 26.0–34.0)
MCHC: 33.4 g/dL (ref 30.0–36.0)
MCV: 88.4 fL (ref 78.0–100.0)
MONOS PCT: 8 % (ref 3–12)
MPV: 10.2 fL (ref 8.6–12.4)
Monocytes Absolute: 0.8 10*3/uL (ref 0.1–1.0)
NEUTROS ABS: 4.8 10*3/uL (ref 1.7–7.7)
NEUTROS PCT: 50 % (ref 43–77)
PLATELETS: 277 10*3/uL (ref 150–400)
RBC: 4.23 MIL/uL (ref 3.87–5.11)
RDW: 13.5 % (ref 11.5–15.5)
WBC: 9.5 10*3/uL (ref 4.0–10.5)

## 2015-02-13 LAB — HEMOGLOBIN A1C
HEMOGLOBIN A1C: 7.9 % — AB (ref <5.7)
MEAN PLASMA GLUCOSE: 180 mg/dL — AB (ref <117)

## 2015-02-13 LAB — TSH: TSH: 3.132 u[IU]/mL (ref 0.350–4.500)

## 2015-02-14 LAB — HEPATITIS C ANTIBODY: HCV AB: NEGATIVE

## 2015-02-16 ENCOUNTER — Telehealth: Payer: Self-pay | Admitting: *Deleted

## 2015-02-16 NOTE — Telephone Encounter (Signed)
Attempted to reach patient.  Voicemail full.

## 2015-02-16 NOTE — Telephone Encounter (Signed)
Patient called requesting her lab results. Please advise.

## 2015-02-17 NOTE — Telephone Encounter (Signed)
Called and left message on cell #

## 2015-02-25 ENCOUNTER — Ambulatory Visit: Payer: Medicare Other | Admitting: Family Medicine

## 2015-03-16 ENCOUNTER — Other Ambulatory Visit: Payer: Self-pay | Admitting: Family Medicine

## 2015-05-21 ENCOUNTER — Ambulatory Visit (INDEPENDENT_AMBULATORY_CARE_PROVIDER_SITE_OTHER): Payer: Medicare Other | Admitting: Otolaryngology

## 2015-06-13 ENCOUNTER — Other Ambulatory Visit: Payer: Self-pay | Admitting: Family Medicine

## 2015-06-15 ENCOUNTER — Telehealth: Payer: Self-pay | Admitting: Family Medicine

## 2015-06-15 MED ORDER — TOPROL XL 200 MG PO TB24: 200.0000 mg | ORAL_TABLET | Freq: Every day | ORAL | Status: DC

## 2015-06-15 NOTE — Telephone Encounter (Signed)
Patient is asking for a refill on TOPROL XL 200 MG 24 hr tablet sent to [facility], please advise?

## 2015-06-15 NOTE — Telephone Encounter (Signed)
Refill sent in

## 2015-06-15 NOTE — Telephone Encounter (Signed)
Med refilled.

## 2015-06-23 ENCOUNTER — Other Ambulatory Visit: Payer: Self-pay | Admitting: Family Medicine

## 2015-06-23 ENCOUNTER — Ambulatory Visit (INDEPENDENT_AMBULATORY_CARE_PROVIDER_SITE_OTHER): Payer: Medicare Other | Admitting: Family Medicine

## 2015-06-23 ENCOUNTER — Encounter: Payer: Self-pay | Admitting: Family Medicine

## 2015-06-23 VITALS — BP 150/84 | HR 60 | Resp 16 | Ht 68.0 in | Wt 253.0 lb

## 2015-06-23 DIAGNOSIS — K0510 Chronic gingivitis, plaque induced: Secondary | ICD-10-CM

## 2015-06-23 DIAGNOSIS — I10 Essential (primary) hypertension: Secondary | ICD-10-CM | POA: Diagnosis not present

## 2015-06-23 DIAGNOSIS — I471 Supraventricular tachycardia: Secondary | ICD-10-CM

## 2015-06-23 DIAGNOSIS — IMO0001 Reserved for inherently not codable concepts without codable children: Secondary | ICD-10-CM

## 2015-06-23 DIAGNOSIS — E1121 Type 2 diabetes mellitus with diabetic nephropathy: Secondary | ICD-10-CM

## 2015-06-23 DIAGNOSIS — E119 Type 2 diabetes mellitus without complications: Secondary | ICD-10-CM

## 2015-06-23 DIAGNOSIS — E11630 Type 2 diabetes mellitus with periodontal disease: Secondary | ICD-10-CM

## 2015-06-23 DIAGNOSIS — K219 Gastro-esophageal reflux disease without esophagitis: Secondary | ICD-10-CM

## 2015-06-23 DIAGNOSIS — E785 Hyperlipidemia, unspecified: Secondary | ICD-10-CM

## 2015-06-23 LAB — LIPID PANEL
Cholesterol: 220 mg/dL — ABNORMAL HIGH (ref 125–200)
HDL: 44 mg/dL — AB (ref >=46)
Total CHOL/HDL Ratio: 5 Ratio (ref <=5.0)
Triglycerides: 448 mg/dL — ABNORMAL HIGH (ref <150)

## 2015-06-23 LAB — HEMOGLOBIN A1C
Hgb A1c MFr Bld: 7.5 % — ABNORMAL HIGH (ref <5.7)
MEAN PLASMA GLUCOSE: 169 mg/dL — AB (ref <117)

## 2015-06-23 LAB — COMPLETE METABOLIC PANEL WITH GFR
ALT: 24 U/L (ref 6–29)
AST: 21 U/L (ref 10–35)
Albumin: 4.6 g/dL (ref 3.6–5.1)
Alkaline Phosphatase: 94 U/L (ref 33–130)
BUN: 22 mg/dL (ref 7–25)
CHLORIDE: 104 mmol/L (ref 98–110)
CO2: 25 mmol/L (ref 20–31)
CREATININE: 1.45 mg/dL — AB (ref 0.60–0.93)
Calcium: 9.9 mg/dL (ref 8.6–10.4)
GFR, Est African American: 42 mL/min — ABNORMAL LOW (ref >=60)
GFR, Est Non African American: 36 mL/min — ABNORMAL LOW (ref >=60)
Glucose, Bld: 157 mg/dL — ABNORMAL HIGH (ref 65–99)
Potassium: 4.5 mmol/L (ref 3.5–5.3)
Sodium: 140 mmol/L (ref 135–146)
Total Bilirubin: 0.4 mg/dL (ref 0.2–1.2)
Total Protein: 7.4 g/dL (ref 6.1–8.1)

## 2015-06-23 MED ORDER — CEPHALEXIN 500 MG PO CAPS: 500.0000 mg | ORAL_CAPSULE | Freq: Three times a day (TID) | ORAL | Status: DC

## 2015-06-23 NOTE — Progress Notes (Signed)
Subjective:    Patient ID: Tammy Hurley, female    DOB: [birth year], 70 y.o.   MRN: [id number]  HPI    Tammy Hurley     MRN: [id number]      DOB: 1945/10/12   HPI Tammy Hurley is here initially for physical exam, however she is refusing, hence  Visit changed to  follow up and re-evaluation of chronic medical conditions, medication management and review of any available recent lab and radiology data.  Preventive health is updated, specifically  Cancer screening and Immunization.   Has upcoming endo appt and needs to schedule her ey exam The PT stopped her victoza stating she feels as though she bottoms out when she takes it. She has not been testing blood sugars, "feels well" though she is concerned that sugar is higher than it should be. Has upcoming appt with her endo in March. There are no new concerns.  There are no specific complaints   ROS Denies recent fever or chills. Denies sinus pressure, nasal congestion, ear pain or sore throat. Denies chest congestion, productive cough or wheezing. Denies chest pains, palpitations and leg swelling Denies abdominal pain, nausea, vomiting,diarrhea or constipation.   Denies dysuria, frequency, hesitancy or incontinence. Denies uncontrolled joint pain, swelling and limitation in mobility. Denies headaches, seizures, numbness, or tingling. Denies depression, anxiety or insomnia.Increased stress, daughter ill, had a stroke, and she was recently baby sitting great grand children Denies skin break down or rash.   PE  BP 150/84 mmHg  Pulse 60  Resp 16  Ht 5\' 8"  (1.727 m)  Wt 253 lb (114.76 kg)  BMI 38.48 kg/m2  SpO2 96%  Patient alert and oriented and in no cardiopulmonary distress.  HEENT: No facial asymmetry, EOMI,   oropharynx pink and moist.  Neck supple no JVD, no mass. gingivits and aphthous ulcers x 2 in  mouth Chest: Clear to auscultation bilaterally.  CVS: S1, S2 no murmurs, no S3.Regular rate.  ABD: Soft non tender.     Ext: No edema  MS: Adequate though reduced  ROM spine, shoulders, hips and knees.  Skin: Intact, no ulcerations or rash noted.  Psych: Good eye contact, normal affect. Memory intact not anxious or depressed appearing.  CNS: CN 2-12 intact, power,  normal throughout.no focal deficits noted.   Assessment & Plan           Assessment:      Plan:        Review of Systems     Objective:   Physical Exam        Assessment & Plan:  Essential hypertension Uncontrolled, reports normally BP is good, does not want to change medicationm, states this past weekend was stressful as she was baby sitting DASH diet and commitment to daily physical activity for a minimum of 30 minutes discussed and encouraged, as a part of hypertension management. The importance of attaining a healthy weight is also discussed.  BP/Weight 06/23/2015 01/22/2015 08/06/2014 04/29/2014 12/24/2013 09/30/2013 123456  Systolic BP Q000111Q [id number] 0000000 123456 [id number] Q000111Q  Diastolic BP 84 82 82 80 72 82 80  Wt. (Lbs) 253 255.12 255.4 254 248.4 252 -  BMI 38.48 38.8 39.39 39.77 38.9 39.46 -      Nurse re eval in 6 weeks  Gingivitis with diabetes mellitus ([place]) Day antibiotic course prescribed  Type 2 diabetes with nephropathy Improved, despite the fact that she is non compliant with prescribed medication, she is to have this furhter addressed at  her upcoming endo appt [name] is reminded of the importance of commitment to daily physical activity for 30 minutes or more, as able and the need to limit carbohydrate intake to 30 to 60 grams per meal to help with blood sugar control.   The need to take medication as prescribed, test blood sugar as directed, and to call between visits if there is a concern that blood sugar is uncontrolled is also discussed.   [name] is reminded of the importance of daily foot exam, annual eye examination, and good blood sugar, blood pressure and cholesterol control.  Diabetic  Labs Latest Ref Rng 06/23/2015 06/23/2015 02/11/2015 08/06/2014 05/13/2013  HbA1c <5.7 % - 7.5(H) 7.9(H) - -  Microalbumin <2.0 mg/dL - - - 3.5(H) -  Micro/Creat Ratio 0.0 - 30.0 mg/g - - - 17.6 -  Chol 125 - 200 mg/dL 204(H) 220(H) 174 - -  HDL >=46 mg/dL 44(L) 44(L) 35(L) - -  Calc LDL <130 mg/dL NOT CALC NOT CALC 78 - -  Triglycerides <150 mg/dL 447(H) 448(H) 305(H) - -  Creatinine 0.60 - 0.93 mg/dL - 1.45(H) 1.45(H) - 1.56(H)   BP/Weight 06/23/2015 01/22/2015 08/06/2014 04/29/2014 12/24/2013 09/30/2013 123456  Systolic BP [id number] 0000000 123456 [id number] Q000111Q  Diastolic BP 84 82 82 80 72 82 80  Wt. (Lbs) 253 255.12 255.4 254 248.4 252 -  BMI 38.48 38.8 39.39 39.77 38.9 39.46 -   Foot/eye exam completion dates 01/22/2015 12/24/2013  Foot exam Order - -  Foot Form Completion Done Done   Updated labs sent. Pt reports does not take victoza as she feels as though her blood sugar falls too low when sh does     Obesity, Class II, BMI 35.0-39.9, with comorbidity (see actual BMI) Unchanged. Patient re-educated about  the importance of commitment to a  minimum of 150 minutes of exercise per week.  The importance of healthy food choices with portion control discussed. Encouraged to start a food diary, count calories and to consider  joining a support group. Sample diet sheets offered. Goals set by the patient for the next several months.   Weight /BMI 06/23/2015 01/22/2015 08/06/2014  WEIGHT 253 lb 255 lb 1.9 oz 255 lb 6.4 oz  HEIGHT 5\' 8"  5\' 8"  5' 7.5"  BMI 38.48 kg/m2 38.8 kg/m2 39.39 kg/m2    Current exercise per week 90 minutes.   Hyperlipidemia LDL goal <100 Deteriorated, refuses statin therapy Hyperlipidemia:Low fat diet discussed and encouraged.   Lipid Panel  Lab Results  Component Value Date   CHOL 220* 06/23/2015   CHOL 204* 06/23/2015   HDL 44* 06/23/2015   HDL 44* 06/23/2015   LDLCALC NOT CALC 06/23/2015   LDLCALC NOT CALC 06/23/2015   LDLDIRECT 66 06/23/2015   TRIG 448*  06/23/2015   TRIG 447* 06/23/2015   CHOLHDL 5.0 06/23/2015   CHOLHDL 4.6 06/23/2015        PAROXYSMAL SUPRAVENTRICULAR TACHYCARDIA No recent episodes of palpitations, remains compliant with medication  GERD Controlled, no change in medication

## 2015-06-23 NOTE — Patient Instructions (Signed)
Nurse BP check in 6 weeks  MD follow up in 3 month, call if you need me sooner, with rectal  Pls work on lifestyle changes as we discussed, blood pressure, cholesterol and blood sugar are too high  Fasting lipid, cmp and EGFr, and hBA1C today, labs will be sent to your diabetes Specialist   Pls schedule mammogram today  5 day course of antibiotic sent in for gum infection (keflex)  Thanks for choosing [facility], we consider it a privelige to serve you.

## 2015-06-25 LAB — LIPID PANEL
CHOL/HDL RATIO: 4.6 ratio (ref <=5.0)
Cholesterol: 204 mg/dL — ABNORMAL HIGH (ref 125–200)
HDL: 44 mg/dL — ABNORMAL LOW (ref >=46)
Triglycerides: 447 mg/dL — ABNORMAL HIGH (ref <150)

## 2015-06-25 LAB — LDL CHOLESTEROL, DIRECT: LDL DIRECT: 66 mg/dL (ref <130)

## 2015-06-28 NOTE — Assessment & Plan Note (Signed)
No recent episodes of palpitations, remains compliant with medication

## 2015-06-28 NOTE — Assessment & Plan Note (Signed)
Day antibiotic course prescribed

## 2015-06-28 NOTE — Assessment & Plan Note (Signed)
Uncontrolled, reports normally BP is good, does not want to change medicationm, states this past weekend was stressful as she was baby sitting DASH diet and commitment to daily physical activity for a minimum of 30 minutes discussed and encouraged, as a part of hypertension management. The importance of attaining a healthy weight is also discussed.  BP/Weight 06/23/2015 01/22/2015 08/06/2014 04/29/2014 12/24/2013 09/30/2013 123456  Systolic BP [id number] 0000000 123456 [id number] Q000111Q  Diastolic BP 84 82 82 80 72 82 80  Wt. (Lbs) 253 255.12 255.4 254 248.4 252 -  BMI 38.48 38.8 39.39 39.77 38.9 39.46 -      Nurse re eval in 6 weeks

## 2015-06-28 NOTE — Assessment & Plan Note (Signed)
Improved, despite the fact that she is non compliant with prescribed medication, she is to have this furhter addressed at her upcoming endo appt Tammy Hurley is reminded of the importance of commitment to daily physical activity for 30 minutes or more, as able and the need to limit carbohydrate intake to 30 to 60 grams per meal to help with blood sugar control.   The need to take medication as prescribed, test blood sugar as directed, and to call between visits if there is a concern that blood sugar is uncontrolled is also discussed.   Tammy Hurley is reminded of the importance of daily foot exam, annual eye examination, and good blood sugar, blood pressure and cholesterol control.  Diabetic Labs Latest Ref Rng 06/23/2015 06/23/2015 02/11/2015 08/06/2014 05/13/2013  HbA1c <5.7 % - 7.5(H) 7.9(H) - -  Microalbumin <2.0 mg/dL - - - 3.5(H) -  Micro/Creat Ratio 0.0 - 30.0 mg/g - - - 17.6 -  Chol 125 - 200 mg/dL 204(H) 220(H) 174 - -  HDL >=46 mg/dL 44(L) 44(L) 35(L) - -  Calc LDL <130 mg/dL NOT CALC NOT CALC 78 - -  Triglycerides <150 mg/dL 447(H) 448(H) 305(H) - -  Creatinine 0.60 - 0.93 mg/dL - 1.45(H) 1.45(H) - 1.56(H)   BP/Weight 06/23/2015 01/22/2015 08/06/2014 04/29/2014 12/24/2013 09/30/2013 123456  Systolic BP [id number] 0000000 123456 [id number] Q000111Q  Diastolic BP 84 82 82 80 72 82 80  Wt. (Lbs) 253 255.12 255.4 254 248.4 252 -  BMI 38.48 38.8 39.39 39.77 38.9 39.46 -   Foot/eye exam completion dates 01/22/2015 12/24/2013  Foot exam Order - -  Foot Form Completion Done Done   Updated labs sent. Pt reports does not take victoza as she feels as though her blood sugar falls too low when sh does

## 2015-06-28 NOTE — Assessment & Plan Note (Signed)
Unchanged. Patient re-educated about  the importance of commitment to a  minimum of 150 minutes of exercise per week.  The importance of healthy food choices with portion control discussed. Encouraged to start a food diary, count calories and to consider  joining a support group. Sample diet sheets offered. Goals set by the patient for the next several months.   Weight /BMI 06/23/2015 01/22/2015 08/06/2014  WEIGHT 253 lb 255 lb 1.9 oz 255 lb 6.4 oz  HEIGHT 5\' 8"  5\' 8"  5' 7.5"  BMI 38.48 kg/m2 38.8 kg/m2 39.39 kg/m2    Current exercise per week 90 minutes.

## 2015-06-28 NOTE — Assessment & Plan Note (Signed)
Deteriorated, refuses statin therapy Hyperlipidemia:Low fat diet discussed and encouraged.   Lipid Panel  Lab Results  Component Value Date   CHOL 220* 06/23/2015   CHOL 204* 06/23/2015   HDL 44* 06/23/2015   HDL 44* 06/23/2015   LDLCALC NOT CALC 06/23/2015   LDLCALC NOT CALC 06/23/2015   LDLDIRECT 66 06/23/2015   TRIG 448* 06/23/2015   TRIG 447* 06/23/2015   CHOLHDL 5.0 06/23/2015   CHOLHDL 4.6 06/23/2015

## 2015-06-28 NOTE — Assessment & Plan Note (Signed)
Controlled, no change in medication  

## 2015-07-02 ENCOUNTER — Other Ambulatory Visit: Payer: Self-pay | Admitting: Family Medicine

## 2015-07-20 ENCOUNTER — Ambulatory Visit: Payer: Medicare Other | Admitting: Family Medicine

## 2015-08-04 ENCOUNTER — Ambulatory Visit: Payer: Medicare Other

## 2015-08-04 VITALS — BP 128/80

## 2015-08-04 DIAGNOSIS — I10 Essential (primary) hypertension: Secondary | ICD-10-CM

## 2015-08-04 NOTE — Patient Instructions (Signed)
Continue the same medication   Keep next follow up appt

## 2015-08-04 NOTE — Progress Notes (Signed)
Continue the same meds and keep next follow up appt

## 2015-08-18 ENCOUNTER — Other Ambulatory Visit: Payer: Self-pay | Admitting: Family Medicine

## 2015-09-04 LAB — HM DIABETES EYE EXAM

## 2015-09-14 ENCOUNTER — Encounter: Payer: Self-pay | Admitting: Family Medicine

## 2015-09-14 ENCOUNTER — Other Ambulatory Visit: Payer: Self-pay

## 2015-09-14 ENCOUNTER — Other Ambulatory Visit: Payer: Self-pay | Admitting: Family Medicine

## 2015-09-14 ENCOUNTER — Ambulatory Visit (INDEPENDENT_AMBULATORY_CARE_PROVIDER_SITE_OTHER): Payer: Medicare Other | Admitting: Family Medicine

## 2015-09-14 VITALS — BP 148/78 | HR 84 | Resp 18 | Ht 68.0 in | Wt 254.0 lb

## 2015-09-14 DIAGNOSIS — Z2821 Immunization not carried out because of patient refusal: Secondary | ICD-10-CM

## 2015-09-14 DIAGNOSIS — E1121 Type 2 diabetes mellitus with diabetic nephropathy: Secondary | ICD-10-CM | POA: Diagnosis not present

## 2015-09-14 DIAGNOSIS — I10 Essential (primary) hypertension: Secondary | ICD-10-CM | POA: Diagnosis not present

## 2015-09-14 DIAGNOSIS — Z9119 Patient's noncompliance with other medical treatment and regimen: Secondary | ICD-10-CM

## 2015-09-14 DIAGNOSIS — IMO0001 Reserved for inherently not codable concepts without codable children: Secondary | ICD-10-CM

## 2015-09-14 DIAGNOSIS — Z91199 Patient's noncompliance with other medical treatment and regimen due to unspecified reason: Secondary | ICD-10-CM

## 2015-09-14 MED ORDER — TRIAMTERENE-HCTZ 75-50 MG PO TABS: ORAL_TABLET | ORAL | Status: DC

## 2015-09-14 MED ORDER — POTASSIUM CHLORIDE CRYS ER 20 MEQ PO TBCR: 20.0000 meq | EXTENDED_RELEASE_TABLET | Freq: Two times a day (BID) | ORAL | Status: DC

## 2015-09-14 MED ORDER — TOPROL XL 200 MG PO TB24: 200.0000 mg | ORAL_TABLET | Freq: Every day | ORAL | Status: DC

## 2015-09-14 MED ORDER — COLCHICINE 0.6 MG PO TABS: 0.6000 mg | ORAL_TABLET | Freq: Every day | ORAL | Status: DC

## 2015-09-14 NOTE — Progress Notes (Signed)
Subjective:    Patient ID: Tammy Hurley, female    DOB: 11-06-1945, 70 y.o.   MRN: [id number]  HPI   Tammy Hurley     MRN: [id number]      DOB: 05-27-1945   HPI Tammy Hurley is here for follow up and re-evaluation of chronic medical conditions, medication management and review of any available recent lab and radiology data.  Preventive health is updated, specifically  Cancer screening and Immunization.   Questions or concerns regarding consultations or procedures which the PT has had in the interim are  addressed. The PT denies any adverse reactions to current medications since the last visit.  Relocating and will seek new Provider reportedly in 2018 due to insurance purposes, states will return for wellness visit. Refuses needed immunization Denies polyuria, polydipsia, blurred vision , or hypoglycemic episodes.    ROS Denies recent fever or chills. Denies sinus pressure, nasal congestion, ear pain or sore throat. Denies chest congestion, productive cough or wheezing. Denies chest pains, palpitations and leg swelling Denies abdominal pain, nausea, vomiting,diarrhea or constipation.   Denies dysuria, frequency, hesitancy or incontinence. Denies uncontrolled joint pain, swelling and limitation in mobility. Denies headaches, seizures, numbness, or tingling. Denies depression, anxiety or insomnia. Denies skin break down or rash.   PE  BP 148/78 mmHg  Pulse 84  Resp 18  Ht 5\' 8"  (1.727 m)  Wt 254 lb (115.214 kg)  BMI 38.63 kg/m2  SpO2 97%  Patient alert and oriented and in no cardiopulmonary distress.  HEENT: No facial asymmetry, EOMI,   oropharynx pink and moist.  Neck supple no JVD, no mass.  Chest: Clear to auscultation bilaterally.  CVS: S1, S2 no murmurs, no S3.Regular rate.  ABD: Soft non tender.   Ext: No edema  MS: Adequate though reduced  ROM spine, shoulders, hips and knees.  Skin: Intact, no ulcerations or rash noted.  Psych: Good eye contact,  normal affect. Memory intact not anxious or depressed appearing.  CNS: CN 2-12 intact, power,  normal throughout.no focal deficits noted.   Assessment & Plan   Essential hypertension Uncontrolled due to non compliance DASH diet and commitment to daily physical activity for a minimum of 30 minutes discussed and encouraged, as a part of hypertension management. The importance of attaining a healthy weight is also discussed.  BP/Weight 09/14/2015 08/04/2015 06/23/2015 01/22/2015 08/06/2014 99991111 [id number]  Systolic BP 123456 0000000 [id number] 0000000 123456  Diastolic BP 78 80 84 82 82 80 72  Wt. (Lbs) 254 - 253 255.12 255.4 254 248.4  BMI 38.63 - 38.48 38.8 39.39 39.77 38.9        Type 2 diabetes with nephropathy Followed by endo in [facility] Tammy Hurley is reminded of the importance of commitment to daily physical activity for 30 minutes or more, as able and the need to limit carbohydrate intake to 30 to 60 grams per meal to help with blood sugar control.   The need to take medication as prescribed, test blood sugar as directed, and to call between visits if there is a concern that blood sugar is uncontrolled is also discussed.   Tammy Hurley is reminded of the importance of daily foot exam, annual eye examination, and good blood sugar, blood pressure and cholesterol control.  Diabetic Labs Latest Ref Rng 09/14/2015 06/23/2015 06/23/2015 02/11/2015 08/06/2014  HbA1c <5.7 % - - 7.5(H) 7.9(H) -  Microalbumin Not estab mg/dL 5.5 - - - 3.5(H)  Micro/Creat Ratio <30 mcg/mg creat 22 - - -  17.6  Chol 125 - 200 mg/dL - 204(H) 220(H) 174 -  HDL >=46 mg/dL - 44(L) 44(L) 35(L) -  Calc LDL <130 mg/dL - NOT CALC NOT CALC 78 -  Triglycerides <150 mg/dL - 447(H) 448(H) 305(H) -  Creatinine 0.60 - 0.93 mg/dL - - 1.45(H) 1.45(H) -   BP/Weight 09/14/2015 08/04/2015 06/23/2015 01/22/2015 08/06/2014 [id number] [id number]  Systolic BP 123456 0000000 [id number] 0000000 123456  Diastolic BP 78 80 84 82 82 80 72  Wt. (Lbs) 254 - 253 255.12  255.4 254 248.4  BMI 38.63 - 38.48 38.8 39.39 39.77 38.9   Foot/eye exam completion dates Latest Ref Rng 09/04/2015 01/22/2015  Eye Exam No Retinopathy No Retinopathy -  Foot exam Order - - -  Foot Form Completion - - Done         Immunization refused Pneumonia vaccine offered and again refused, re educated re protective value of immunization  Non compliance with medical treatment dexa and mammogram past due, pt to schedule and follow through  Obesity, Class II, BMI 35.0-39.9, with comorbidity (see actual BMI) Deteriorated. Patient re-educated about  the importance of commitment to a  minimum of 150 minutes of exercise per week.  The importance of healthy food choices with portion control discussed. Encouraged to start a food diary, count calories and to consider  joining a support group. Sample diet sheets offered. Goals set by the patient for the next several months.   Weight /BMI 09/14/2015 06/23/2015 01/22/2015  WEIGHT 254 lb 253 lb 255 lb 1.9 oz  HEIGHT 5\' 8"  5\' 8"  5\' 8"   BMI 38.63 kg/m2 38.48 kg/m2 38.8 kg/m2    Current exercise per week 90 minutes.        Review of Systems     Objective:   Physical Exam        Assessment & Plan:

## 2015-09-14 NOTE — Patient Instructions (Addendum)
Annual wellness in 4.5 month, call if you need me sooner  Blood pressure elevated today, important to take medication as prescribed  Please work on good  health habits so that your health will improve. 1. Commitment to daily physical activity for 30 to 60  minutes, if you are able to do this.  2. Commitment to wise food choices. Aim for half of your  food intake to be vegetable and fruit, one quarter starchy foods, and one quarter protein. Try to eat on a regular schedule  3 meals per day, snacking between meals should be limited to vegetables or fruits or small portions of nuts. 64 ounces of water per day is generally recommended, unless you have specific health conditions, like heart failure or kidney failure where you will need to limit fluid intake.  3. Commitment to sufficient and a  good quality of physical and mental rest daily, generally between 6 to 8 hours per day.  WITH PERSISTANCE AND PERSEVERANCE, THE IMPOSSIBLE , BECOMES THE NORM!   ALL THE BEST! Six months meds authorized  Need mammogram, bone density test and pneumonia vaccine  Thank you  for choosing [facility]. We consider it a privelige to serve you.  Delivering excellent health care in a caring and  compassionate way is our goal.  Partnering with you,  so that together we can achieve this goal is our strategy.

## 2015-09-15 LAB — MICROALBUMIN / CREATININE URINE RATIO
CREATININE, URINE: 255 mg/dL (ref 20–320)
MICROALB UR: 5.5 mg/dL
Microalb Creat Ratio: 22 mcg/mg creat (ref <30)

## 2015-09-21 DIAGNOSIS — Z9119 Patient's noncompliance with other medical treatment and regimen: Secondary | ICD-10-CM | POA: Insufficient documentation

## 2015-09-21 DIAGNOSIS — Z91199 Patient's noncompliance with other medical treatment and regimen due to unspecified reason: Secondary | ICD-10-CM | POA: Insufficient documentation

## 2015-09-21 DIAGNOSIS — Z2821 Immunization not carried out because of patient refusal: Secondary | ICD-10-CM | POA: Insufficient documentation

## 2015-09-21 NOTE — Assessment & Plan Note (Signed)
dexa and mammogram past due, pt to schedule and follow through

## 2015-09-21 NOTE — Assessment & Plan Note (Signed)
Pneumonia vaccine offered and again refused, re educated re protective value of immunization

## 2015-09-21 NOTE — Assessment & Plan Note (Signed)
Deteriorated. Patient re-educated about  the importance of commitment to a  minimum of 150 minutes of exercise per week.  The importance of healthy food choices with portion control discussed. Encouraged to start a food diary, count calories and to consider  joining a support group. Sample diet sheets offered. Goals set by the patient for the next several months.   Weight /BMI 09/14/2015 06/23/2015 01/22/2015  WEIGHT 254 lb 253 lb 255 lb 1.9 oz  HEIGHT 5\' 8"  5\' 8"  5\' 8"   BMI 38.63 kg/m2 38.48 kg/m2 38.8 kg/m2    Current exercise per week 90 minutes.

## 2015-09-21 NOTE — Assessment & Plan Note (Signed)
Uncontrolled due to non compliance DASH diet and commitment to daily physical activity for a minimum of 30 minutes discussed and encouraged, as a part of hypertension management. The importance of attaining a healthy weight is also discussed.  BP/Weight 09/14/2015 08/04/2015 06/23/2015 01/22/2015 08/06/2014 [id number] 123456  Systolic BP 123456 0000000 [id number] 0000000 123456  Diastolic BP 78 80 84 82 82 80 72  Wt. (Lbs) 254 - 253 255.12 255.4 254 248.4  BMI 38.63 - 38.48 38.8 39.39 39.77 38.9

## 2015-09-21 NOTE — Assessment & Plan Note (Signed)
Followed by endo in [facility] Tammy Hurley is reminded of the importance of commitment to daily physical activity for 30 minutes or more, as able and the need to limit carbohydrate intake to 30 to 60 grams per meal to help with blood sugar control.   The need to take medication as prescribed, test blood sugar as directed, and to call between visits if there is a concern that blood sugar is uncontrolled is also discussed.   Tammy Hurley is reminded of the importance of daily foot exam, annual eye examination, and good blood sugar, blood pressure and cholesterol control.  Diabetic Labs Latest Ref Rng 09/14/2015 06/23/2015 06/23/2015 02/11/2015 08/06/2014  HbA1c <5.7 % - - 7.5(H) 7.9(H) -  Microalbumin Not estab mg/dL 5.5 - - - 3.5(H)  Micro/Creat Ratio <30 mcg/mg creat 22 - - - 17.6  Chol 125 - 200 mg/dL - 204(H) 220(H) 174 -  HDL >=46 mg/dL - 44(L) 44(L) 35(L) -  Calc LDL <130 mg/dL - NOT CALC NOT CALC 78 -  Triglycerides <150 mg/dL - 447(H) 448(H) 305(H) -  Creatinine 0.60 - 0.93 mg/dL - - 1.45(H) 1.45(H) -   BP/Weight 09/14/2015 08/04/2015 06/23/2015 01/22/2015 08/06/2014 [id number] [id number]  Systolic BP 123456 0000000 [id number] 0000000 123456  Diastolic BP 78 80 84 82 82 80 72  Wt. (Lbs) 254 - 253 255.12 255.4 254 248.4  BMI 38.63 - 38.48 38.8 39.39 39.77 38.9   Foot/eye exam completion dates Latest Ref Rng 09/04/2015 01/22/2015  Eye Exam No Retinopathy No Retinopathy -  Foot exam Order - - -  Foot Form Completion - - Done

## 2015-09-23 ENCOUNTER — Ambulatory Visit (HOSPITAL_COMMUNITY)
Admission: RE | Admit: 2015-09-23 | Discharge: 2015-09-23 | Disposition: A | Discharge status: Home / Self Care | Payer: Medicare Other | Source: Ambulatory Visit | Attending: Family Medicine | Admitting: Family Medicine

## 2015-09-23 DIAGNOSIS — R928 Other abnormal and inconclusive findings on diagnostic imaging of breast: Secondary | ICD-10-CM | POA: Insufficient documentation

## 2015-09-23 DIAGNOSIS — Z1231 Encounter for screening mammogram for malignant neoplasm of breast: Secondary | ICD-10-CM | POA: Diagnosis not present

## 2015-09-24 ENCOUNTER — Other Ambulatory Visit: Payer: Self-pay | Admitting: Family Medicine

## 2015-10-02 ENCOUNTER — Other Ambulatory Visit: Payer: Self-pay | Admitting: Family Medicine

## 2015-10-02 DIAGNOSIS — R928 Other abnormal and inconclusive findings on diagnostic imaging of breast: Secondary | ICD-10-CM

## 2015-10-20 ENCOUNTER — Ambulatory Visit (HOSPITAL_COMMUNITY)
Admission: RE | Admit: 2015-10-20 | Discharge: 2015-10-20 | Disposition: A | Discharge status: Home / Self Care | Payer: Medicare Other | Source: Ambulatory Visit | Attending: Family Medicine | Admitting: Family Medicine

## 2015-10-20 DIAGNOSIS — N63 Unspecified lump in breast: Secondary | ICD-10-CM | POA: Diagnosis present

## 2015-10-20 DIAGNOSIS — R928 Other abnormal and inconclusive findings on diagnostic imaging of breast: Secondary | ICD-10-CM

## 2015-10-20 DIAGNOSIS — N6002 Solitary cyst of left breast: Secondary | ICD-10-CM | POA: Insufficient documentation

## 2015-12-10 ENCOUNTER — Other Ambulatory Visit: Payer: Self-pay | Admitting: Family Medicine

## 2016-03-03 ENCOUNTER — Encounter: Payer: Self-pay | Admitting: Family Medicine

## 2016-04-09 ENCOUNTER — Emergency Department (HOSPITAL_COMMUNITY)
Admission: EM | Admit: 2016-04-09 | Discharge: 2016-04-09 | Disposition: A | Discharge status: Home / Self Care | Payer: Medicare Other | Attending: Emergency Medicine | Admitting: Emergency Medicine

## 2016-04-09 ENCOUNTER — Encounter (HOSPITAL_COMMUNITY): Payer: Self-pay | Admitting: Emergency Medicine

## 2016-04-09 ENCOUNTER — Emergency Department (HOSPITAL_COMMUNITY): Payer: Medicare Other

## 2016-04-09 DIAGNOSIS — Z79899 Other long term (current) drug therapy: Secondary | ICD-10-CM | POA: Insufficient documentation

## 2016-04-09 DIAGNOSIS — Z23 Encounter for immunization: Secondary | ICD-10-CM | POA: Diagnosis not present

## 2016-04-09 DIAGNOSIS — Y999 Unspecified external cause status: Secondary | ICD-10-CM | POA: Insufficient documentation

## 2016-04-09 DIAGNOSIS — Z7984 Long term (current) use of oral hypoglycemic drugs: Secondary | ICD-10-CM | POA: Insufficient documentation

## 2016-04-09 DIAGNOSIS — Z87891 Personal history of nicotine dependence: Secondary | ICD-10-CM | POA: Insufficient documentation

## 2016-04-09 DIAGNOSIS — Y9301 Activity, walking, marching and hiking: Secondary | ICD-10-CM | POA: Insufficient documentation

## 2016-04-09 DIAGNOSIS — W101XXA Fall (on)(from) sidewalk curb, initial encounter: Secondary | ICD-10-CM | POA: Diagnosis not present

## 2016-04-09 DIAGNOSIS — I10 Essential (primary) hypertension: Secondary | ICD-10-CM | POA: Insufficient documentation

## 2016-04-09 DIAGNOSIS — S0181XA Laceration without foreign body of other part of head, initial encounter: Secondary | ICD-10-CM | POA: Diagnosis not present

## 2016-04-09 DIAGNOSIS — E119 Type 2 diabetes mellitus without complications: Secondary | ICD-10-CM | POA: Insufficient documentation

## 2016-04-09 DIAGNOSIS — Y929 Unspecified place or not applicable: Secondary | ICD-10-CM | POA: Diagnosis not present

## 2016-04-09 DIAGNOSIS — R51 Headache: Secondary | ICD-10-CM | POA: Insufficient documentation

## 2016-04-09 DIAGNOSIS — S0083XA Contusion of other part of head, initial encounter: Secondary | ICD-10-CM

## 2016-04-09 DIAGNOSIS — W19XXXA Unspecified fall, initial encounter: Secondary | ICD-10-CM

## 2016-04-09 MED ORDER — BACITRACIN-NEOMYCIN-POLYMYXIN 400-5-5000 EX OINT
TOPICAL_OINTMENT | CUTANEOUS | Status: AC
  Administered 2016-04-09: 1 via TOPICAL
  Filled 2016-04-09: qty 1

## 2016-04-09 MED ORDER — HYDROCODONE-ACETAMINOPHEN 5-325 MG PO TABS: 2.0000 | ORAL_TABLET | ORAL | 0 refills | Status: DC | PRN

## 2016-04-09 MED ORDER — TETANUS-DIPHTH-ACELL PERTUSSIS 5-2.5-18.5 LF-MCG/0.5 IM SUSP
0.5000 mL | Freq: Once | INTRAMUSCULAR | Status: AC
  Administered 2016-04-09: 0.5 mL via INTRAMUSCULAR
  Filled 2016-04-09: qty 0.5

## 2016-04-09 MED ORDER — AMOXICILLIN 500 MG PO CAPS: 500.0000 mg | ORAL_CAPSULE | Freq: Three times a day (TID) | ORAL | 0 refills | Status: DC

## 2016-04-09 MED ORDER — BACITRACIN-NEOMYCIN-POLYMYXIN 400-5-5000 EX OINT
TOPICAL_OINTMENT | Freq: Once | CUTANEOUS | Status: AC
  Administered 2016-04-09: 1 via TOPICAL

## 2016-04-09 NOTE — ED Triage Notes (Signed)
Pt with laceration to her upper lip, R cheek and nose from a fall this am. No LOC.

## 2016-04-09 NOTE — Discharge Instructions (Signed)
Return if any problems.  See your Physician for recheck on Monday  

## 2016-04-10 NOTE — ED Provider Notes (Signed)
[place] DEPT Provider Note   CSN: [id number] Arrival date & time: 04/09/16  1309     History   Chief Complaint Chief Complaint  Patient presents with  . Laceration    HPI Tammy Hurley is a 70 y.o. female.  The history is provided by the patient. No language interpreter was used.  Laceration   The incident occurred 1 to 2 hours ago. The laceration is located on the face. The laceration is 2 cm in size. The laceration mechanism is unknown.The pain is moderate. The pain has been constant since onset. It is unknown if a foreign body is present. Her tetanus status is out of date.  Pt rripped over an irregularity ion side walk and injured face.  Pt complains of a laceration to her face and right upper lip.  No loc.   Past Medical History:  Diagnosis Date  . Back pain   . Diabetes mellitus, type 2 ([place])   . Gastroesophageal reflux disease   . Hypercholesteremia   . Hypertension   . Obesity   . Osteoarthritis   . Palpitations   . Supraventricular tachycardia [facility])     Patient Active Problem List   Diagnosis Date Noted  . Non compliance with medical treatment 09/21/2015  . Immunization refused 09/21/2015  . Hearing loss 01/22/2015  . Hyperlipidemia LDL goal <100 07/15/2012  . Sleep apnea, obstructive 05/02/2012  . Type 2 diabetes with nephropathy ([place]) 08/08/2011  . RBBB (right bundle branch block) 12/02/2010  . Back pain with sciatica 07/27/2009  . PAROXYSMAL SUPRAVENTRICULAR TACHYCARDIA 03/17/2009  . GERD 02/03/2008  . Osteoporosis 01/29/2008  . Obesity, Class II, BMI 35.0-39.9, with comorbidity (see actual BMI) 11/30/2007  . Essential hypertension 11/30/2007  . OSTEOARTHRITIS 11/30/2007    Past Surgical History:  Procedure Laterality Date  . ABDOMINAL HYSTERECTOMY    . APPENDECTOMY    . CHOLECYSTECTOMY    . COLONOSCOPY N/A 08/30/2012   Procedure: COLONOSCOPY;  Surgeon: Rogene Houston, MD;  Location: [facility] SUITE;  Service: Endoscopy;  Laterality: N/A;   830-rescheduled to [place] notified pt  . ENUCLEATION  02/07   right   . LEFT OOPHORECTOMY    . refiting of r prosthesis  03/09  . RETINAL DETACHMENT REPAIR W/ SCLERAL BUCKLE LE    . right eye prostheslis  03/07  . TUBAL LIGATION    . VESICOVAGINAL FISTULA CLOSURE W/ TAH      OB History    Gravida Para Term Preterm AB Living   3 3 3          SAB TAB Ectopic Multiple Live Births                   Home Medications    Prior to Admission medications   Medication Sig Start Date End Date Taking? Authorizing Provider  acetaminophen (TYLENOL) 500 MG tablet Take 1,000 mg by mouth every 6 (six) hours as needed.   Yes Historical Provider, MD  aspirin EC 81 MG tablet Take 81 mg by mouth daily.   Yes Historical Provider, MD  Cholecalciferol (VITAMIN D3) 1000 UNIT capsule Take 4,000-5,000 Units by mouth daily. Two tabs by mouth once daily   Yes Historical Provider, MD  colchicine 0.6 MG tablet Take 1 tablet (0.6 mg total) by mouth daily. 09/14/15  Yes Fayrene Helper, MD  Garlic 123XX123 MG CAPS Take 2-3 capsules by mouth daily.    Yes Historical Provider, MD  glipiZIDE (GLUCOTROL) 5 MG tablet Take 5 mg  by mouth every morning.   Yes Historical Provider, MD  lansoprazole (PREVACID) 30 MG capsule Take 15 mg by mouth daily. One tab by mouth once daily 06/10/11  Yes Fayrene Helper, MD  Lecithin 1200 MG CAPS Take 2 capsules by mouth daily.   Yes Historical Provider, MD  Liraglutide (VICTOZA [place]) Inject 1.2 mg into the skin at bedtime. Reported on 06/28/2015   Yes Historical Provider, MD  Misc Natural Products (JOINT SUPPORT COMPLEX PO) Take 1-2 tablets by mouth daily.   Yes Historical Provider, MD  Multiple Vitamins-Minerals (CENTRUM SILVER ADULT 50+ PO) Take 1 tablet by mouth daily.   Yes Historical Provider, MD  Omega 3-6-9 Fatty Acids (OMEGA DHA PO) Take 6 capsules by mouth daily.   Yes Historical Provider, MD  OVER THE COUNTER MEDICATION Take 1 tablet by mouth daily. [place]   Yes Historical  Provider, MD  Polyethyl Glycol-Propyl Glycol (SYSTANE OP) Place 1 drop into the left eye daily.   Yes Historical Provider, MD  potassium chloride SA (K-DUR,KLOR-CON) 20 MEQ tablet TAKE ONE TABLET TWICE DAILY 09/24/15  Yes Fayrene Helper, MD  Tetrahydrozoline HCl (EYE DROPS OP) Apply 1 drop to eye daily. Uses in right eye. Called [facility].   Yes Historical Provider, MD  TOPROL XL 200 MG 24 hr tablet Take 1 tablet (200 mg total) by mouth daily. 09/14/15  Yes Fayrene Helper, MD  triamterene-hydrochlorothiazide (MAXZIDE) 75-50 MG tablet TAKE ONE (1) TABLET EACH DAY 09/14/15  Yes Fayrene Helper, MD  YUCCA EXTRACT PO Take 1 tablet by mouth daily.   Yes Historical Provider, MD  amoxicillin (AMOXIL) 500 MG capsule Take 1 capsule (500 mg total) by mouth 3 (three) times daily. 04/09/16   Fransico Meadow, PA-C  HYDROcodone-acetaminophen (NORCO/VICODIN) 5-325 MG tablet Take 2 tablets by mouth every 4 (four) hours as needed. 04/09/16   Fransico Meadow, PA-C    Family History Family History  Problem Relation Age of Onset  . Cancer Mother     Colon  . Alzheimer's disease Father   . Diabetes Brother     x3  . Hyperlipidemia Sister   . Diabetes Brother   . Diabetes Brother   . Colon cancer Neg Hx     Social History Social History  Substance Use Topics  . Smoking status: Former Smoker    Packs/day: 0.30    Years: 3.00    Types: Cigarettes    Quit date: 04/25/1980  . Smokeless tobacco: Never Used  . Alcohol use No     Comment: Ocassionally     Allergies   Clarithromycin   Review of Systems Review of Systems  All other systems reviewed and are negative.    Physical Exam Updated Vital Signs BP 143/73 (BP Location: Left Arm)   Pulse 61   Temp 97.5 F (36.4 C) (Oral)   Resp 18   Ht 5\' 8"  (1.727 m)   Wt 114.3 kg   SpO2 98%   BMI 38.32 kg/m   Physical Exam  Constitutional: She is oriented to person, place, and time. She appears well-developed and well-nourished.  HENT:    Head: Normocephalic.  Abrasion 2 cm right cheek,  Laceration upper right inner lip  No gapping,   Large swollen area right cheek   Eyes:  Right eye prosthetic,  Left eye appears normal  Neck: Normal range of motion.  Pulmonary/Chest: Effort normal.  Abdominal: She exhibits no distension.  Musculoskeletal: Normal range of motion.  Neurological: She is alert  and oriented to person, place, and time.  Skin: Skin is warm.  Psychiatric: She has a normal mood and affect.  Nursing note and vitals reviewed.    ED Treatments / Results  Labs (all labs ordered are listed, but only abnormal results are displayed) Labs Reviewed - No data to display  EKG  EKG Interpretation None       Radiology Ct Head Wo Contrast  Result Date: 04/09/2016 CLINICAL DATA:  Posterior neck pain and headache following a fall this morning. The patient also has lacerations to the upper lip, right cheek and nose. 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 EXAM: CT HEAD WITHOUT CONTRAST CT MAXILLOFACIAL WITHOUT CONTRAST CT CERVICAL SPINE WITHOUT CONTRAST TECHNIQUE: Multidetector CT imaging of the head, cervical spine, and maxillofacial structures were performed using the standard protocol without intravenous contrast. Multiplanar CT image reconstructions of the cervical spine and maxillofacial structures were also generated. COMPARISON:  Maxillofacial CT dated 02/13/2010. Head CT dated 04/25/2007. Cervical spine MR dated 03/15/2005 and cervical spine CT dated 05/08/2004. FINDINGS: CT HEAD FINDINGS Brain: Diffusely enlarged ventricles and subarachnoid spaces. No intracranial hemorrhage, mass lesion or CT evidence of acute infarction. Vascular: No hyperdense vessel or unexpected calcification. Skull: Normal. Negative for fracture or focal lesion. Other: None. CT MAXILLOFACIAL FINDINGS Osseous: Stable old right orbital floor fracture with inferiorly herniated orbital fat. No acute fractures. Orbits: Stable right globe  prosthesis. Sinuses: Small left maxillary sinus retention cysts and minimal right maxillary sinus mucosal thickening and retention cyst formation, with improvement. Soft tissues: Right facial soft tissue stranding in the subcutaneous fat, compatible with bruising. Mild right upper lip soft tissue swelling, greater on the right. CT CERVICAL SPINE FINDINGS Alignment: Normal. Skull base and vertebrae: No acute fracture. No primary bone lesion or focal pathologic process. Soft tissues and spinal canal: No prevertebral fluid or swelling. No visible canal hematoma. Disc levels: Multilevel degenerative changes. Small to moderate-sized posterior calcified disc herniation at the C3-4 level, causing moderate canal stenosis. No visible acute herniations. Upper chest: Mild biapical pleural thickening. Other: None. IMPRESSION: 1. No skull fracture or intracranial hemorrhage. 2. No acute maxillofacial fracture. 3. No cervical spine fracture or subluxation. 4. Minimal diffuse cerebral and cerebellar atrophy. 5. Cervical spine degenerative changes. Electronically Signed   By: Claudie Revering M.D.   On: 04/09/2016 16:41   Ct Cervical Spine Wo Contrast  Result Date: 04/09/2016 CLINICAL DATA:  Posterior neck pain and headache following a fall this morning. The patient also has lacerations to the upper lip, right cheek and nose. 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 EXAM: CT HEAD WITHOUT CONTRAST CT MAXILLOFACIAL WITHOUT CONTRAST CT CERVICAL SPINE WITHOUT CONTRAST TECHNIQUE: Multidetector CT imaging of the head, cervical spine, and maxillofacial structures were performed using the standard protocol without intravenous contrast. Multiplanar CT image reconstructions of the cervical spine and maxillofacial structures were also generated. COMPARISON:  Maxillofacial CT dated 02/13/2010. Head CT dated 04/25/2007. Cervical spine MR dated 03/15/2005 and cervical spine CT dated 05/08/2004. FINDINGS: CT HEAD FINDINGS Brain:  Diffusely enlarged ventricles and subarachnoid spaces. No intracranial hemorrhage, mass lesion or CT evidence of acute infarction. Vascular: No hyperdense vessel or unexpected calcification. Skull: Normal. Negative for fracture or focal lesion. Other: None. CT MAXILLOFACIAL FINDINGS Osseous: Stable old right orbital floor fracture with inferiorly herniated orbital fat. No acute fractures. Orbits: Stable right globe prosthesis. Sinuses: Small left maxillary sinus retention cysts and minimal right maxillary sinus mucosal thickening and retention cyst formation, with improvement. Soft tissues: Right facial  soft tissue stranding in the subcutaneous fat, compatible with bruising. Mild right upper lip soft tissue swelling, greater on the right. CT CERVICAL SPINE FINDINGS Alignment: Normal. Skull base and vertebrae: No acute fracture. No primary bone lesion or focal pathologic process. Soft tissues and spinal canal: No prevertebral fluid or swelling. No visible canal hematoma. Disc levels: Multilevel degenerative changes. Small to moderate-sized posterior calcified disc herniation at the C3-4 level, causing moderate canal stenosis. No visible acute herniations. Upper chest: Mild biapical pleural thickening. Other: None. IMPRESSION: 1. No skull fracture or intracranial hemorrhage. 2. No acute maxillofacial fracture. 3. No cervical spine fracture or subluxation. 4. Minimal diffuse cerebral and cerebellar atrophy. 5. Cervical spine degenerative changes. Electronically Signed   By: Claudie Revering M.D.   On: 04/09/2016 16:41   Ct Maxillofacial Wo Contrast  Result Date: 04/09/2016 CLINICAL DATA:  Posterior neck pain and headache following a fall this morning. The patient also has lacerations to the upper lip, right cheek and nose. 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 EXAM: CT HEAD WITHOUT CONTRAST CT MAXILLOFACIAL WITHOUT CONTRAST CT CERVICAL SPINE WITHOUT CONTRAST TECHNIQUE: Multidetector CT imaging of the  head, cervical spine, and maxillofacial structures were performed using the standard protocol without intravenous contrast. Multiplanar CT image reconstructions of the cervical spine and maxillofacial structures were also generated. COMPARISON:  Maxillofacial CT dated 02/13/2010. Head CT dated 04/25/2007. Cervical spine MR dated 03/15/2005 and cervical spine CT dated 05/08/2004. FINDINGS: CT HEAD FINDINGS Brain: Diffusely enlarged ventricles and subarachnoid spaces. No intracranial hemorrhage, mass lesion or CT evidence of acute infarction. Vascular: No hyperdense vessel or unexpected calcification. Skull: Normal. Negative for fracture or focal lesion. Other: None. CT MAXILLOFACIAL FINDINGS Osseous: Stable old right orbital floor fracture with inferiorly herniated orbital fat. No acute fractures. Orbits: Stable right globe prosthesis. Sinuses: Small left maxillary sinus retention cysts and minimal right maxillary sinus mucosal thickening and retention cyst formation, with improvement. Soft tissues: Right facial soft tissue stranding in the subcutaneous fat, compatible with bruising. Mild right upper lip soft tissue swelling, greater on the right. CT CERVICAL SPINE FINDINGS Alignment: Normal. Skull base and vertebrae: No acute fracture. No primary bone lesion or focal pathologic process. Soft tissues and spinal canal: No prevertebral fluid or swelling. No visible canal hematoma. Disc levels: Multilevel degenerative changes. Small to moderate-sized posterior calcified disc herniation at the C3-4 level, causing moderate canal stenosis. No visible acute herniations. Upper chest: Mild biapical pleural thickening. Other: None. IMPRESSION: 1. No skull fracture or intracranial hemorrhage. 2. No acute maxillofacial fracture. 3. No cervical spine fracture or subluxation. 4. Minimal diffuse cerebral and cerebellar atrophy. 5. Cervical spine degenerative changes. Electronically Signed   By: Claudie Revering M.D.   On: 04/09/2016  16:41    Procedures Procedures (including critical care time)  Medications Ordered in ED Medications  Tdap (BOOSTRIX) injection 0.5 mL (0.5 mLs Intramuscular Given 04/09/16 1536)  neomycin-bacitracin-polymyxin (NEOSPORIN) ointment (1 application Topical Given 04/09/16 1640)     Initial Impression / Assessment and Plan / ED Course  I have reviewed the triage vital signs and the nursing notes.  Pertinent labs & imaging results that were available during my care of the patient were reviewed by me and considered in my medical decision making (see chart for details).  Clinical Course     Pt counseled on wound care.  Pt given rx for amoxicillian and hydrocodone.   Pt advised to follow up with her MD.   Final Clinical Impressions(s) / ED Diagnoses  Final diagnoses:  Facial laceration, initial encounter  Contusion of face, initial encounter    New Prescriptions Discharge Medication List as of 04/09/2016  5:04 PM    START taking these medications   Details  amoxicillin (AMOXIL) 500 MG capsule Take 1 capsule (500 mg total) by mouth 3 (three) times daily., Starting Sat 04/09/2016, Print    HYDROcodone-acetaminophen (NORCO/VICODIN) 5-325 MG tablet Take 2 tablets by mouth every 4 (four) hours as needed., Starting Sat 04/09/2016, Print      An After Visit Summary was printed and given to the patient.   Hollace Kinnier Kila, PA-C 04/10/16 [place], MD 04/21/16 1020

## 2016-10-05 ENCOUNTER — Ambulatory Visit: Payer: Medicare Other | Admitting: Family Medicine

## 2016-10-24 ENCOUNTER — Ambulatory Visit (INDEPENDENT_AMBULATORY_CARE_PROVIDER_SITE_OTHER): Payer: 59 | Admitting: Family Medicine

## 2016-10-24 ENCOUNTER — Encounter: Payer: Self-pay | Admitting: Family Medicine

## 2016-10-24 VITALS — BP 148/76 | HR 72 | Temp 97.3°F | Resp 14 | Ht 68.0 in | Wt 257.8 lb

## 2016-10-24 DIAGNOSIS — I10 Essential (primary) hypertension: Secondary | ICD-10-CM

## 2016-10-24 DIAGNOSIS — E1121 Type 2 diabetes mellitus with diabetic nephropathy: Secondary | ICD-10-CM

## 2016-10-24 DIAGNOSIS — E785 Hyperlipidemia, unspecified: Secondary | ICD-10-CM

## 2016-10-24 DIAGNOSIS — M5430 Sciatica, unspecified side: Secondary | ICD-10-CM

## 2016-10-24 DIAGNOSIS — M810 Age-related osteoporosis without current pathological fracture: Secondary | ICD-10-CM | POA: Diagnosis not present

## 2016-10-24 DIAGNOSIS — M549 Dorsalgia, unspecified: Secondary | ICD-10-CM | POA: Diagnosis not present

## 2016-10-24 DIAGNOSIS — Z1231 Encounter for screening mammogram for malignant neoplasm of breast: Secondary | ICD-10-CM | POA: Diagnosis not present

## 2016-10-24 DIAGNOSIS — K219 Gastro-esophageal reflux disease without esophagitis: Secondary | ICD-10-CM

## 2016-10-24 DIAGNOSIS — Z2821 Immunization not carried out because of patient refusal: Secondary | ICD-10-CM

## 2016-10-24 MED ORDER — TOPROL XL 200 MG PO TB24: 200.0000 mg | ORAL_TABLET | Freq: Every day | ORAL | 3 refills | Status: DC

## 2016-10-24 MED ORDER — TRIAMTERENE-HCTZ 75-50 MG PO TABS: 1.0000 | ORAL_TABLET | Freq: Every day | ORAL | 3 refills | Status: DC

## 2016-10-24 NOTE — Patient Instructions (Addendum)
F/u in 4 months, call if oyu need me before  Foot exam today is good.  Please get your mammogram and bone density tests scheduled, both are past due, we can arrange to call you with theses appts and will attempt to schedule on same day since   Urine isent for  microalbumin  Your blood pressure is high , need to reduce salt intake and work on regular exercise and weight loss  Thank you  for choosing [facility]. We consider it a privelige to serve you.  Delivering excellent health care in a caring and  compassionate way is our goal.  Partnering with you,  so that together we can achieve this goal is our strategy.

## 2016-10-25 LAB — MICROALBUMIN / CREATININE URINE RATIO
Creatinine, Urine: 130 mg/dL (ref 20–320)
MICROALB UR: 1.7 mg/dL
MICROALB/CREAT RATIO: 13 ug/mg{creat} (ref <30)

## 2016-11-01 ENCOUNTER — Encounter: Payer: Self-pay | Admitting: Family Medicine

## 2016-11-01 NOTE — Assessment & Plan Note (Signed)
Not currently at goal, will re address at next visit if stiill elevated , medication will need to be increased DASH diet and commitment to daily physical activity for a minimum of 30 minutes discussed and encouraged, as a part of hypertension management. The importance of attaining a healthy weight is also discussed.  BP/Weight 10/24/2016 04/09/2016 09/14/2015 08/04/2015 06/23/2015 01/22/2015 3/83/2919  Systolic BP 166 060 045 997 741 423 953  Diastolic BP 76 73 78 80 84 82 82  Wt. (Lbs) 257.75 252 254 - 253 255.12 255.4  BMI 39.19 38.32 38.63 - 38.48 38.8 39.39

## 2016-11-01 NOTE — Assessment & Plan Note (Signed)
No current or recent flare, encouraged good back hygiene

## 2016-11-01 NOTE — Progress Notes (Signed)
Tammy Hurley     MRN: [id number]      DOB: 1946/04/11   HPI Tammy Hurley is here for follow up and re-evaluation of chronic medical conditions, medication management and review of any available recent lab and radiology data.  Preventive health is updated, specifically  Cancer screening and Immunization.   Questions or concerns regarding consultations or procedures which the PT has had in the interim are  Addressed. Will continue to follow endo in [place] and ophthalmology in [place] she is returning her  Primary care here to [place] has had difficulty getting her medications and her new PCP is changing practice location The PT denies any adverse reactions to current medications since the last visit.  There are no new concerns.  There are no specific complaints   ROS Denies recent fever or chills. Denies sinus pressure, nasal congestion, ear pain or sore throat. Denies chest congestion, productive cough or wheezing. Denies chest pains, palpitations and leg swelling Denies abdominal pain, nausea, vomiting,diarrhea or constipation.   Denies dysuria, frequency, hesitancy or incontinence. Denies joint pain, swelling and limitation in mobility. Denies headaches, seizures, numbness, or tingling. Denies depression, anxiety or insomnia. Denies skin break down or rash.   PE  BP (!) 148/76 (BP Location: Left Arm, Patient Position: Sitting)   Pulse 72   Temp 97.3 F (36.3 C)   Wt 257 lb 12 oz (116.9 kg)   SpO2 96%   BMI 39.19 kg/m   Patient alert and oriented and in no cardiopulmonary distress.  HEENT: No facial asymmetry, EOMI,   oropharynx pink and moist.  Neck supple no JVD, no mass.  Chest: Clear to auscultation bilaterally.  CVS: S1, S2 no murmurs, no S3.Regular rate.  ABD: Soft non tender.   Ext: No edema  MS: Adequate though reduced  ROM spine, shoulders, hips and knees.  Skin: Intact, no ulcerations or rash noted.  Psych: Good eye contact, normal  affect. Memory intact not anxious or depressed appearing.  CNS: CN 2-12 intact, power,  normal throughout.no focal deficits noted.   Assessment & Plan  Type 2 diabetes with nephropathy Managed by endo. Need updated info Has been well controlled  Essential hypertension Not currently at goal, will re address at next visit if stiill elevated , medication will need to be increased DASH diet and commitment to daily physical activity for a minimum of 30 minutes discussed and encouraged, as a part of hypertension management. The importance of attaining a healthy weight is also discussed.  BP/Weight 10/24/2016 04/09/2016 09/14/2015 08/04/2015 06/23/2015 01/22/2015 7/67/2094  Systolic BP 709 628 366 294 765 465 035  Diastolic BP 76 73 78 80 84 82 82  Wt. (Lbs) 257.75 252 254 - 253 255.12 255.4  BMI 39.19 38.32 38.63 - 38.48 38.8 39.39       GERD Controlled, no change in medication   Back pain with sciatica No current or recent flare, encouraged good back hygiene  Hyperlipidemia LDL goal <100 Hyperlipidemia:Low fat diet discussed and encouraged.   Lipid Panel  Lab Results  Component Value Date   CHOL 220 (H) 06/23/2015   CHOL 204 (H) 06/23/2015   HDL 44 (L) 06/23/2015   HDL 44 (L) 06/23/2015   LDLCALC NOT CALC 06/23/2015   LDLCALC NOT CALC 06/23/2015   LDLDIRECT 66 06/23/2015   TRIG 448 (H) 06/23/2015   TRIG 447 (H) 06/23/2015   CHOLHDL 5.0 06/23/2015   CHOLHDL 4.6 06/23/2015   Updated lab needed will be on the  lookout from labs from endo     Immunization refused Refuses pneumonia vaccine again, re educated re need

## 2016-11-01 NOTE — Assessment & Plan Note (Signed)
Refuses pneumonia vaccine again, re educated re need

## 2016-11-01 NOTE — Assessment & Plan Note (Signed)
Controlled, no change in medication  

## 2016-11-01 NOTE — Assessment & Plan Note (Signed)
Managed by endo. Need updated info Has been well controlled

## 2016-11-01 NOTE — Assessment & Plan Note (Signed)
Hyperlipidemia:Low fat diet discussed and encouraged.   Lipid Panel  Lab Results  Component Value Date   CHOL 220 (H) 06/23/2015   CHOL 204 (H) 06/23/2015   HDL 44 (L) 06/23/2015   HDL 44 (L) 06/23/2015   LDLCALC NOT CALC 06/23/2015   LDLCALC NOT CALC 06/23/2015   LDLDIRECT 66 06/23/2015   TRIG 448 (H) 06/23/2015   TRIG 447 (H) 06/23/2015   CHOLHDL 5.0 06/23/2015   CHOLHDL 4.6 06/23/2015   Updated lab needed will be on the lookout from labs from endo

## 2016-11-03 ENCOUNTER — Other Ambulatory Visit (HOSPITAL_COMMUNITY): Payer: Medicare Other

## 2016-11-03 ENCOUNTER — Ambulatory Visit (HOSPITAL_COMMUNITY): Payer: Medicare Other

## 2016-11-07 ENCOUNTER — Ambulatory Visit (HOSPITAL_COMMUNITY): Payer: Medicare Other

## 2016-11-07 ENCOUNTER — Other Ambulatory Visit (HOSPITAL_COMMUNITY): Payer: Medicare Other

## 2016-11-10 ENCOUNTER — Ambulatory Visit (HOSPITAL_COMMUNITY): Payer: Medicare Other

## 2016-11-10 ENCOUNTER — Other Ambulatory Visit (HOSPITAL_COMMUNITY): Payer: Medicare Other

## 2016-11-30 LAB — LDL CHOLESTEROL, DIRECT
A1c: 8.9
LDL: 79

## 2017-01-23 ENCOUNTER — Ambulatory Visit: Payer: Medicare Other | Admitting: Family Medicine

## 2017-01-27 ENCOUNTER — Other Ambulatory Visit: Payer: Self-pay | Admitting: Family Medicine

## 2017-01-27 DIAGNOSIS — Z1231 Encounter for screening mammogram for malignant neoplasm of breast: Secondary | ICD-10-CM

## 2017-01-30 ENCOUNTER — Ambulatory Visit (HOSPITAL_COMMUNITY)
Admission: RE | Admit: 2017-01-30 | Discharge: 2017-01-30 | Disposition: A | Discharge status: Home / Self Care | Payer: Medicare Other | Source: Ambulatory Visit | Attending: Family Medicine | Admitting: Family Medicine

## 2017-01-30 ENCOUNTER — Ambulatory Visit (INDEPENDENT_AMBULATORY_CARE_PROVIDER_SITE_OTHER): Payer: Medicare Other | Admitting: Family Medicine

## 2017-01-30 ENCOUNTER — Encounter: Payer: Self-pay | Admitting: Family Medicine

## 2017-01-30 VITALS — BP 142/82 | HR 64 | Temp 98.2°F | Resp 16 | Ht 68.0 in | Wt 255.5 lb

## 2017-01-30 DIAGNOSIS — Z23 Encounter for immunization: Secondary | ICD-10-CM

## 2017-01-30 DIAGNOSIS — E8940 Asymptomatic postprocedural ovarian failure: Secondary | ICD-10-CM

## 2017-01-30 DIAGNOSIS — Z1231 Encounter for screening mammogram for malignant neoplasm of breast: Secondary | ICD-10-CM | POA: Diagnosis present

## 2017-01-30 DIAGNOSIS — I10 Essential (primary) hypertension: Secondary | ICD-10-CM

## 2017-01-30 DIAGNOSIS — E1121 Type 2 diabetes mellitus with diabetic nephropathy: Secondary | ICD-10-CM | POA: Diagnosis not present

## 2017-01-30 DIAGNOSIS — K219 Gastro-esophageal reflux disease without esophagitis: Secondary | ICD-10-CM

## 2017-01-30 DIAGNOSIS — E785 Hyperlipidemia, unspecified: Secondary | ICD-10-CM

## 2017-01-30 DIAGNOSIS — M810 Age-related osteoporosis without current pathological fracture: Secondary | ICD-10-CM | POA: Diagnosis not present

## 2017-01-30 NOTE — Patient Instructions (Addendum)
F/U in 4 months, call if you need me before  Flu vaccine today  Please ask your diabetic doctor to work  with you and your diet and blood sugar   Please schedule your bone density test today  Getting out to work/ volunteer will be good for you  It is important that you exercise regularly at least 30 minutes 5 times a week. If you develop chest pain, have severe difficulty breathing, or feel very tired, stop exercising immediately and seek medical attention  Please work on good  health habits so that your health will improve. 1. Commitment to daily physical activity for 30 to 60  minutes, if you are able to do this.  2. Commitment to wise food choices. Aim for half of your  food intake to be vegetable and fruit, one quarter starchy foods, and one quarter protein. Try to eat on a regular schedule  3 meals per day, snacking between meals should be limited to vegetables or fruits or small portions of nuts. 64 ounces of water per day is generally recommended, unless you have specific health conditions, like heart failure or kidney failure where you will need to limit fluid intake.  3. Commitment to sufficient and a  good quality of physical and mental rest daily, generally between 6 to 8 hours per day.  WITH PERSISTANCE AND PERSEVERANCE, THE IMPOSSIBLE , BECOMES THE NORM!  Thanks for choosing [facility], we consider it a privelige to serve you.

## 2017-02-05 ENCOUNTER — Encounter: Payer: Self-pay | Admitting: Family Medicine

## 2017-02-05 NOTE — Assessment & Plan Note (Signed)
Deteriorated. Patient re-educated about  the importance of commitment to a  minimum of 150 minutes of exercise per week.  The importance of healthy food choices with portion control discussed. Encouraged to start a food diary, count calories and to consider  joining a support group. Sample diet sheets offered. Goals set by the patient for the next several months.   Weight /BMI 01/30/2017 10/24/2016 04/09/2016  WEIGHT 255 lb 8 oz 257 lb 12 oz 252 lb  HEIGHT 5\' 8"  5\' 8"  5\' 8"   BMI 38.85 kg/m2 39.19 kg/m2 38.32 kg/m2

## 2017-02-05 NOTE — Assessment & Plan Note (Signed)
Uncontrolled, no med change, lifestyle focus at this time DASH diet and commitment to daily physical activity for a minimum of 30 minutes discussed and encouraged, as a part of hypertension management. The importance of attaining a healthy weight is also discussed.  BP/Weight 01/30/2017 10/24/2016 04/09/2016 09/14/2015 08/04/2015 06/23/2015 1/68/3729  Systolic BP 021 115 520 802 233 612 244  Diastolic BP 82 76 73 78 80 84 82  Wt. (Lbs) 255.5 257.75 252 254 - 253 255.12  BMI 38.85 39.19 38.32 38.63 - 38.48 38.8

## 2017-02-05 NOTE — Assessment & Plan Note (Signed)
Controlled, no change in medication  

## 2017-02-05 NOTE — Assessment & Plan Note (Signed)
Needs dexa, she is to have this scheduled

## 2017-02-05 NOTE — Assessment & Plan Note (Signed)
Deteriorated, non compliant with management , treated by endo. Reports increased stress currently

## 2017-02-05 NOTE — Assessment & Plan Note (Signed)
Updated lab needed at/ before next visit.   

## 2017-02-05 NOTE — Progress Notes (Signed)
   SKYLEY GRANDMAISON     MRN: [id number]      DOB: [birth year]   HPI Tammy Hurley is here for follow up and re-evaluation of chronic medical conditions, medication management and review of any available recent lab and radiology data.  Preventive health is updated, specifically  Cancer screening and Immunization.   Questions or concerns regarding consultations or procedures which the PT has had in the interim are  addressed. The PT denies any adverse reactions to current medications since the last visit.  There are no new concerns.  There are no specific complaints   ROS Denies recent fever or chills. Denies sinus pressure, nasal congestion, ear pain or sore throat. Denies chest congestion, productive cough or wheezing. Denies chest pains, palpitations and leg swelling Denies abdominal pain, nausea, vomiting,diarrhea or constipation.   Denies dysuria, frequency, hesitancy or incontinence. Denies joint pain, swelling and limitation in mobility. Denies headaches, seizures, numbness, or tingling. Denies depression, anxiety or insomnia. Denies skin break down or rash.   PE  BP (!) 142/82   Pulse 64   Temp 98.2 F (36.8 C) (Other (Comment))   Resp 16   Ht 5\' 8"  (1.727 m)   Wt 255 lb 8 oz (115.9 kg)   SpO2 98%   BMI 38.85 kg/m   Patient alert and oriented and in no cardiopulmonary distress.  HEENT: No facial asymmetry, EOMI,   oropharynx pink and moist.  Neck supple no JVD, no mass.  Chest: Clear to auscultation bilaterally.  CVS: S1, S2 no murmurs, no S3.Regular rate.  ABD: Soft non tender.   Ext: No edema  MS: decreased  ROM spine, shoulders, hips and knees.  Skin: Intact, no ulcerations or rash noted.  Psych: Good eye contact, normal affect. Memory intact not anxious or depressed appearing.  CNS: CN 2-12 intact, power,  normal throughout.no focal deficits noted.   Assessment & Plan  Essential hypertension Uncontrolled, no med change, lifestyle focus at this  time DASH diet and commitment to daily physical activity for a minimum of 30 minutes discussed and encouraged, as a part of hypertension management. The importance of attaining a healthy weight is also discussed.  BP/Weight 01/30/2017 10/24/2016 04/09/2016 09/14/2015 08/04/2015 06/23/2015 04/30/2692  Systolic BP 854 627 035 009 381 829 937  Diastolic BP 82 76 73 78 80 84 82  Wt. (Lbs) 255.5 257.75 252 254 - 253 255.12  BMI 38.85 39.19 38.32 38.63 - 38.48 38.8       Hyperlipidemia LDL goal <100 Updated lab needed at/ before next visit.   Osteoporosis Needs dexa, she is to have this scheduled  Obesity, Class II, BMI 35.0-39.9, with comorbidity (see actual BMI) Deteriorated. Patient re-educated about  the importance of commitment to a  minimum of 150 minutes of exercise per week.  The importance of healthy food choices with portion control discussed. Encouraged to start a food diary, count calories and to consider  joining a support group. Sample diet sheets offered. Goals set by the patient for the next several months.   Weight /BMI 01/30/2017 10/24/2016 04/09/2016  WEIGHT 255 lb 8 oz 257 lb 12 oz 252 lb  HEIGHT 5\' 8"  5\' 8"  5\' 8"   BMI 38.85 kg/m2 39.19 kg/m2 38.32 kg/m2      GERD Controlled, no change in medication   Type 2 diabetes with nephropathy Deteriorated, non compliant with management , treated by endo. Reports increased stress currently

## 2017-02-15 ENCOUNTER — Telehealth: Payer: Self-pay | Admitting: Family Medicine

## 2017-02-15 MED ORDER — POTASSIUM CHLORIDE CRYS ER 20 MEQ PO TBCR: 20.0000 meq | EXTENDED_RELEASE_TABLET | Freq: Two times a day (BID) | ORAL | 1 refills | Status: DC

## 2017-02-15 MED ORDER — TRIAMTERENE-HCTZ 75-50 MG PO TABS: 1.0000 | ORAL_TABLET | Freq: Every day | ORAL | 3 refills | Status: DC

## 2017-02-15 NOTE — Telephone Encounter (Signed)
Meds refilled and PA re-sent

## 2017-02-15 NOTE — Telephone Encounter (Signed)
Checking on PA for toprol xl Refill request Potassium & maxzide  [facility]  In [place]

## 2017-03-13 ENCOUNTER — Telehealth: Payer: Self-pay | Admitting: Family Medicine

## 2017-03-13 NOTE — Telephone Encounter (Signed)
Can come at 3 today (I know she lives far off so if not today then can come wed am if slots available) or be worked in with [employer]

## 2017-03-13 NOTE — Telephone Encounter (Signed)
Patient has rash on both sides of neck, requesting an appt

## 2017-03-15 ENCOUNTER — Encounter: Payer: Self-pay | Admitting: Family Medicine

## 2017-03-15 ENCOUNTER — Other Ambulatory Visit: Payer: Self-pay

## 2017-03-15 ENCOUNTER — Ambulatory Visit (INDEPENDENT_AMBULATORY_CARE_PROVIDER_SITE_OTHER): Payer: Medicare Other | Admitting: Family Medicine

## 2017-03-15 VITALS — BP 140/86 | HR 80 | Resp 16 | Ht 68.0 in | Wt 247.0 lb

## 2017-03-15 DIAGNOSIS — Z9119 Patient's noncompliance with other medical treatment and regimen: Secondary | ICD-10-CM

## 2017-03-15 DIAGNOSIS — I10 Essential (primary) hypertension: Secondary | ICD-10-CM

## 2017-03-15 DIAGNOSIS — Z91199 Patient's noncompliance with other medical treatment and regimen due to unspecified reason: Secondary | ICD-10-CM

## 2017-03-15 DIAGNOSIS — L309 Dermatitis, unspecified: Secondary | ICD-10-CM

## 2017-03-15 DIAGNOSIS — Z2821 Immunization not carried out because of patient refusal: Secondary | ICD-10-CM

## 2017-03-15 DIAGNOSIS — L239 Allergic contact dermatitis, unspecified cause: Secondary | ICD-10-CM | POA: Diagnosis not present

## 2017-03-15 MED ORDER — BETAMETHASONE DIPROPIONATE 0.05 % EX CREA: TOPICAL_CREAM | Freq: Two times a day (BID) | CUTANEOUS | 0 refills | Status: DC

## 2017-03-15 MED ORDER — BETAMETHASONE DIPROPIONATE 0.05 % EX CREA: TOPICAL_CREAM | Freq: Two times a day (BID) | CUTANEOUS | 0 refills | Status: AC

## 2017-03-15 NOTE — Assessment & Plan Note (Signed)
Acute pruritic rash on right neck

## 2017-03-15 NOTE — Patient Instructions (Signed)
F/u as before, call if you need me sooner  Steroid cream sent  To the rash on your neck, which is due an allergic reaction   Please continue to take your blood sugar seriously, get involved with diabetic educator/ class, call your nurse regularly, excellent support teams are available through health insurance companies   Test regularly  Thanks for choosing [facility], we consider it a privelige to serve you.

## 2017-03-19 DIAGNOSIS — L239 Allergic contact dermatitis, unspecified cause: Secondary | ICD-10-CM | POA: Insufficient documentation

## 2017-03-19 NOTE — Assessment & Plan Note (Signed)
Markedly elevated blood sugar reported, had discontinued victoza recently plans to resume Managed by endo, has upcoming appt, advised attendance at diabetic class Tammy Hurley is reminded of the importance of commitment to daily physical activity for 30 minutes or more, as able and the need to limit carbohydrate intake to 30 to 60 grams per meal to help with blood sugar control.   The need to take medication as prescribed, test blood sugar as directed, and to call between visits if there is a concern that blood sugar is uncontrolled is also discussed.   Tammy Hurley is reminded of the importance of daily foot exam, annual eye examination, and good blood sugar, blood pressure and cholesterol control.  Diabetic Labs Latest Ref Rng & Units 10/24/2016 09/14/2015 06/23/2015 06/23/2015 02/11/2015  HbA1c <5.7 % - - - 7.5(H) 7.9(H)  Microalbumin Not estab mg/dL 1.7 5.5 - - -  Micro/Creat Ratio <30 mcg/mg creat 13 22 - - -  Chol 125 - 200 mg/dL - - 204(H) 220(H) 174  HDL >=46 mg/dL - - 44(L) 44(L) 35(L)  Calc LDL <130 mg/dL - - NOT CALC NOT CALC 78  Triglycerides <150 mg/dL - - 447(H) 448(H) 305(H)  Creatinine 0.60 - 0.93 mg/dL - - - 1.45(H) 1.45(H)   BP/Weight 03/15/2017 01/30/2017 10/24/2016 04/09/2016 09/14/2015 08/04/2015 07/16/5571  Systolic BP 220 254 270 623 762 831 517  Diastolic BP 86 82 76 73 78 80 84  Wt. (Lbs) 247 255.5 257.75 252 254 - 253  BMI 37.56 38.85 39.19 38.32 38.63 - 38.48   Foot/eye exam completion dates Latest Ref Rng & Units 10/24/2016 09/14/2015  Eye Exam No Retinopathy - -  Foot exam Order - - -  Foot Form Completion - Done Done

## 2017-03-19 NOTE — Assessment & Plan Note (Signed)
Controlled, no change in medication DASH diet and commitment to daily physical activity for a minimum of 30 minutes discussed and encouraged, as a part of hypertension management. The importance of attaining a healthy weight is also discussed.  BP/Weight 03/15/2017 01/30/2017 10/24/2016 04/09/2016 09/14/2015 08/04/2015 1/57/2620  Systolic BP 355 974 163 845 364 680 321  Diastolic BP 86 82 76 73 78 80 84  Wt. (Lbs) 247 255.5 257.75 252 254 - 253  BMI 37.56 38.85 39.19 38.32 38.63 - 38.48

## 2017-03-19 NOTE — Progress Notes (Signed)
Tammy Hurley     MRN: [id number]      DOB: [birth year]   HPI Tammy Hurley is here for follow up and re-evaluation of chronic medical conditions, medication management and review of any available recent lab and radiology data.  Preventive health is updated, specifically  Cancer screening and Immunization.   Questions or concerns regarding consultations or procedures which the PT has had in the interim are  addressed. The PT denies any adverse reactions to current medications since the last visit.  There are no new concerns.  There are no specific complaints   ROS Denies recent fever or chills. Denies sinus pressure, nasal congestion, ear pain or sore throat. Denies chest congestion, productive cough or wheezing. Denies chest pains, palpitations and leg swelling Denies abdominal pain, nausea, vomiting,diarrhea or constipation.   Denies dysuria, frequency, hesitancy or incontinence. Denies joint pain, swelling and limitation in mobility. Denies headaches, seizures, numbness, or tingling. Denies depression, anxiety or insomnia. Denies skin break down or rash.   PE  BP 140/86   Pulse 80   Resp 16   Ht 5\' 8"  (1.727 m)   Wt 247 lb (112 kg)   SpO2 97%   BMI 37.56 kg/m   Patient alert and oriented and in no cardiopulmonary distress.  HEENT: No facial asymmetry, EOMI,   oropharynx pink and moist.  Neck supple no JVD, no mass.  Chest: Clear to auscultation bilaterally.  CVS: S1, S2 no murmurs, no S3.Regular rate.  ABD: Soft non tender.   Ext: No edema  MS: Adequate ROM spine, shoulders, hips and knees.  Skin: Intact, erythematous  Macular rash, no purulent drainage or warmth  Psych: Good eye contact, normal affect. Memory intact not anxious or depressed appearing.  CNS: CN 2-12 intact, power,  normal throughout.no focal deficits noted.   Assessment & Plan  Dermatitis Acute pruritic rash on right neck  Allergic dermatitis Acute rash on neck, erythematous and  pruritic, likely due to irritant, topical steroid  To be applied  Essential hypertension Controlled, no change in medication DASH diet and commitment to daily physical activity for a minimum of 30 minutes discussed and encouraged, as a part of hypertension management. The importance of attaining a healthy weight is also discussed.  BP/Weight 03/15/2017 01/30/2017 10/24/2016 04/09/2016 09/14/2015 08/04/2015 0/16/0109  Systolic BP 323 557 322 025 427 062 376  Diastolic BP 86 82 76 73 78 80 84  Wt. (Lbs) 247 255.5 257.75 252 254 - 253  BMI 37.56 38.85 39.19 38.32 38.63 - 38.48       Non compliance with medical treatment Markedly elevated blood sugar reported, had discontinued victoza recently plans to resume Managed by endo, has upcoming appt, advised attendance at diabetic class Tammy Hurley is reminded of the importance of commitment to daily physical activity for 30 minutes or more, as able and the need to limit carbohydrate intake to 30 to 60 grams per meal to help with blood sugar control.   The need to take medication as prescribed, test blood sugar as directed, and to call between visits if there is a concern that blood sugar is uncontrolled is also discussed.   Tammy Hurley is reminded of the importance of daily foot exam, annual eye examination, and good blood sugar, blood pressure and cholesterol control.  Diabetic Labs Latest Ref Rng & Units 10/24/2016 09/14/2015 06/23/2015 06/23/2015 02/11/2015  HbA1c <5.7 % - - - 7.5(H) 7.9(H)  Microalbumin Not estab mg/dL 1.7 5.5 - - -  Micro/Creat  Ratio <30 mcg/mg creat 13 22 - - -  Chol 125 - 200 mg/dL - - 204(H) 220(H) 174  HDL >=46 mg/dL - - 44(L) 44(L) 35(L)  Calc LDL <130 mg/dL - - NOT CALC NOT CALC 78  Triglycerides <150 mg/dL - - 447(H) 448(H) 305(H)  Creatinine 0.60 - 0.93 mg/dL - - - 1.45(H) 1.45(H)   BP/Weight 03/15/2017 01/30/2017 10/24/2016 04/09/2016 09/14/2015 08/04/2015 8/89/1694  Systolic BP 503 888 280 034 917 915 056  Diastolic BP 86  82 76 73 78 80 84  Wt. (Lbs) 247 255.5 257.75 252 254 - 253  BMI 37.56 38.85 39.19 38.32 38.63 - 38.48   Foot/eye exam completion dates Latest Ref Rng & Units 10/24/2016 09/14/2015  Eye Exam No Retinopathy - -  Foot exam Order - - -  Foot Form Completion - Done Done        Obesity, Class II, BMI 35.0-39.9, with comorbidity (see actual BMI) Deteriorated. Patient re-educated about  the importance of commitment to a  minimum of 150 minutes of exercise per week.  The importance of healthy food choices with portion control discussed. Encouraged to start a food diary, count calories and to consider  joining a support group. Sample diet sheets offered. Goals set by the patient for the next several months.   Weight /BMI 03/15/2017 01/30/2017 10/24/2016  WEIGHT 247 lb 255 lb 8 oz 257 lb 12 oz  HEIGHT 5\' 8"  5\' 8"  5\' 8"   BMI 37.56 kg/m2 38.85 kg/m2 39.19 kg/m2      Immunization refused Continues to refuse influenza vaccine which is indicated

## 2017-03-19 NOTE — Assessment & Plan Note (Signed)
Deteriorated. Patient re-educated about  the importance of commitment to a  minimum of 150 minutes of exercise per week.  The importance of healthy food choices with portion control discussed. Encouraged to start a food diary, count calories and to consider  joining a support group. Sample diet sheets offered. Goals set by the patient for the next several months.   Weight /BMI 03/15/2017 01/30/2017 10/24/2016  WEIGHT 247 lb 255 lb 8 oz 257 lb 12 oz  HEIGHT 5\' 8"  5\' 8"  5\' 8"   BMI 37.56 kg/m2 38.85 kg/m2 39.19 kg/m2

## 2017-03-19 NOTE — Assessment & Plan Note (Signed)
Acute rash on neck, erythematous and pruritic, likely due to irritant, topical steroid  To be applied

## 2017-03-19 NOTE — Assessment & Plan Note (Signed)
Continues to refuse influenza vaccine which is indicated

## 2017-05-16 ENCOUNTER — Ambulatory Visit: Payer: Medicare Other | Admitting: Family Medicine

## 2017-05-16 ENCOUNTER — Encounter: Payer: Self-pay | Admitting: Family Medicine

## 2017-05-16 VITALS — BP 158/84 | HR 84 | Resp 16 | Ht 68.0 in | Wt 248.0 lb

## 2017-05-16 DIAGNOSIS — M79671 Pain in right foot: Secondary | ICD-10-CM | POA: Diagnosis not present

## 2017-05-16 DIAGNOSIS — E785 Hyperlipidemia, unspecified: Secondary | ICD-10-CM

## 2017-05-16 DIAGNOSIS — E790 Hyperuricemia without signs of inflammatory arthritis and tophaceous disease: Secondary | ICD-10-CM | POA: Diagnosis not present

## 2017-05-16 DIAGNOSIS — I10 Essential (primary) hypertension: Secondary | ICD-10-CM

## 2017-05-16 DIAGNOSIS — M79672 Pain in left foot: Secondary | ICD-10-CM

## 2017-05-16 DIAGNOSIS — E1121 Type 2 diabetes mellitus with diabetic nephropathy: Secondary | ICD-10-CM | POA: Diagnosis not present

## 2017-05-16 DIAGNOSIS — I471 Supraventricular tachycardia: Secondary | ICD-10-CM | POA: Diagnosis not present

## 2017-05-16 MED ORDER — AMLODIPINE BESYLATE 5 MG PO TABS
5.0000 mg | ORAL_TABLET | Freq: Every day | ORAL | 1 refills | Status: DC
Start: 2017-05-16 — End: 2017-08-02

## 2017-05-16 NOTE — Patient Instructions (Addendum)
F/u as before  Dr Marcina Millard [facility] endocrine, [place]  Dr .Tobe Sos  We will locate rheumatologist in [facility] and refer Will contact niece for suggestion  Dr   Erlene Quan for suggestion if possible [phone] per patient request  Labst today CBC, lipid , HBA1C, cmp and EGFR, TSHand Vit D , uric acid will be forwarded  On review of labs I do not be;lieve that it is necessary to refer the patient to a rheumatologist at this time and am holding off on the referral I  Have made several  attempts to contact her to speak with her and will have the lab results and my recommendations mailed in the event that I am unable to contact her  Labs are also being sent to her endocrinologist and to her Podiatrist

## 2017-05-16 NOTE — Assessment & Plan Note (Addendum)
Bilateral foot pain since early December , Dr Charna Archer at [facility] and ankle to follow , unclear  etiology started on right foot , involves both  Uric acid level checked and is normal Pain likely due to diabetic neuropathy, will advise pt to d/c allopurinol and leave management of pain to endo and podiatry. I had mentioned gabapentin at visit, but I really believe best managed by the Docs she has close access to and to Specialist care as this has been almost a 2 month problem which has not improved and which she already has started getting care from a Podiatrist who she is engaged with  Rheumatology had been mentioned , however , based on current labs I do not believe necessary at this time, will see what endo thinks also

## 2017-05-17 ENCOUNTER — Other Ambulatory Visit: Payer: Self-pay | Admitting: Family Medicine

## 2017-05-17 LAB — COMPLETE METABOLIC PANEL WITH GFR
AG Ratio: 1.4 (calc) (ref 1.0–2.5)
ALBUMIN MSPROF: 4.1 g/dL (ref 3.6–5.1)
ALT: 30 U/L — ABNORMAL HIGH (ref 6–29)
AST: 23 U/L (ref 10–35)
Alkaline phosphatase (APISO): 146 U/L — ABNORMAL HIGH (ref 33–130)
BILIRUBIN TOTAL: 0.2 mg/dL (ref 0.2–1.2)
BUN / CREAT RATIO: 18 (calc) (ref 6–22)
BUN: 31 mg/dL — ABNORMAL HIGH (ref 7–25)
CHLORIDE: 108 mmol/L (ref 98–110)
CO2: 24 mmol/L (ref 20–32)
Calcium: 9.5 mg/dL (ref 8.6–10.4)
Creat: 1.73 mg/dL — ABNORMAL HIGH (ref 0.60–0.93)
GFR, EST AFRICAN AMERICAN: 33 mL/min/{1.73_m2} — AB (ref >=60)
GFR, EST NON AFRICAN AMERICAN: 29 mL/min/{1.73_m2} — AB (ref >=60)
GLUCOSE: 129 mg/dL (ref 65–139)
Globulin: 3 g/dL (calc) (ref 1.9–3.7)
Potassium: 4.3 mmol/L (ref 3.5–5.3)
Sodium: 141 mmol/L (ref 135–146)
TOTAL PROTEIN: 7.1 g/dL (ref 6.1–8.1)

## 2017-05-17 LAB — CBC
HEMATOCRIT: 35.3 % (ref 35.0–45.0)
Hemoglobin: 11.7 g/dL (ref 11.7–15.5)
MCH: 28.7 pg (ref 27.0–33.0)
MCHC: 33.1 g/dL (ref 32.0–36.0)
MCV: 86.5 fL (ref 80.0–100.0)
MPV: 10 fL (ref 7.5–12.5)
Platelets: 388 10*3/uL (ref 140–400)
RBC: 4.08 10*6/uL (ref 3.80–5.10)
RDW: 13 % (ref 11.0–15.0)
WBC: 10.8 10*3/uL (ref 3.8–10.8)

## 2017-05-17 LAB — LIPID PANEL
CHOL/HDL RATIO: 5.5 (calc) — AB (ref <5.0)
Cholesterol: 181 mg/dL (ref <200)
HDL: 33 mg/dL — AB (ref >50)
LDL Cholesterol (Calc): 107 mg/dL (calc) — ABNORMAL HIGH
NON-HDL CHOLESTEROL (CALC): 148 mg/dL — AB (ref <130)
TRIGLYCERIDES: 304 mg/dL — AB (ref <150)

## 2017-05-17 LAB — HEMOGLOBIN A1C
Hgb A1c MFr Bld: 8.9 % of total Hgb — ABNORMAL HIGH (ref <5.7)
Mean Plasma Glucose: 209 (calc)
eAG (mmol/L): 11.6 (calc)

## 2017-05-17 LAB — TSH: TSH: 2.64 mIU/L (ref 0.40–4.50)

## 2017-05-17 LAB — URIC ACID: Uric Acid, Serum: 5.3 mg/dL (ref 2.5–7.0)

## 2017-05-17 LAB — VITAMIN D 25 HYDROXY (VIT D DEFICIENCY, FRACTURES): Vit D, 25-Hydroxy: 39 ng/mL (ref 30–100)

## 2017-05-17 MED ORDER — PRAVASTATIN SODIUM 10 MG PO TABS: 10.0000 mg | ORAL_TABLET | Freq: Every day | ORAL | 3 refills | Status: DC

## 2017-05-19 ENCOUNTER — Encounter: Payer: Self-pay | Admitting: Family Medicine

## 2017-05-19 ENCOUNTER — Telehealth: Payer: Self-pay

## 2017-05-19 NOTE — Telephone Encounter (Signed)
-----   Message from Fayrene Helper, MD sent at 05/19/2017  8:50 AM EST ----- Regarding: visit is closed Pls add on the lab note that you mail that I am holding off on referring her to rheumatology at this time based on her labs and the history and exam, thanks

## 2017-05-19 NOTE — Progress Notes (Signed)
Tammy Hurley     MRN: [id number]      DOB: 1945/11/15   HPI Tammy Hurley is here c/o bilateral foot pain across the dorsum of both feet that has been present since early December which is not improving. She staves she is here for help!  Sates initially the pain was in the great toe, she was treated for gout, no improvements. Has had gout in the past. Has been seen by 2 different podiatrists, one believes that it is gout reportedly the other does not. Also states she at times has  Pain in the left wrisr butdenies significant warmth or swelling of the wrist. She has been on both allopurinol and prednisone  For the problem, her blood sugar has been uncontrolled prior to this and she states it is now worse. Has appt in the next week with her endocrinologist. She agrees to labs today to be faxed to her endo Doc ROS Denies recent fever or chills. Denies sinus pressure, nasal congestion, ear pain or sore throat. Denies chest congestion, productive cough or wheezing. Denies chest pains, palpitations and leg swelling Denies abdominal pain, nausea, vomiting,diarrhea or constipation.   Denies dysuria, frequency, hesitancy or incontinence. Denies joint pain, swelling and limitation in mobility. Denies headaches, seizures, numbness, or tingling. Denies depression, anxiety or insomnia. Denies skin break down or rash.   PE  BP (!) 158/84   Pulse 84   Resp 16   Ht 5\' 8"  (1.727 m)   Wt 248 lb (112.5 kg)   SpO2 97%   BMI 37.71 kg/m   Patient alert and oriented and in no cardiopulmonary distress.  HEENT: No facial asymmetry, EOMI,   oropharynx pink and moist.  Neck supple no JVD, no mass.  Chest: Clear to auscultation bilaterally.  CVS: S1, S2 no murmurs, no S3.Regular rate.  ABD: Soft non tender.   Ext: No edema  CN:[id number] though adequate  ROM spine, shoulders, hips and knees. Deformity of knees   Skin: Intact, no ulcerations or rash noted.No erythema or warmth Pain localized to  dorsal aspect of both feet which are mildly erythematous but not warm Psych: Good eye contact, normal affect. Memory intact not anxious or depressed appearing.  CNS: CN 2-12 intact, power,  normal throughout.no focal deficits noted.   Assessment & Plan  Foot pain, bilateral Bilateral foot pain since early December , Dr Charna Archer at [facility] and ankle to follow , unclear  etiology started on right foot , involves both  Uric acid level checked and is normal Pain likely due to diabetic neuropathy, will advise pt to d/c allopurinol and leave management of pain to endo and podiatry. I had mentioned gabapentin at visit, but I really believe best managed by the Docs she has close access to and to Specialist care as this has been almost a 2 month problem which has not improved and which she already has started getting care from a Podiatrist who she is engaged with  Rheumatology had been mentioned , however , based on current labs I do not believe necessary at this time, will see what endo thinks also  Essential hypertension Uncontrolled add amlodipine 5 mg daily DASH diet and commitment to daily physical activity for a minimum of 30 minutes discussed and encouraged, as a part of hypertension management. The importance of attaining a healthy weight is also discussed.  BP/Weight 05/16/2017 03/15/2017 01/30/2017 10/24/2016 04/09/2016 09/14/2015 10/22/4763  Systolic BP 465 035 465 681 275 170 017  Diastolic BP  84 86 82 76 73 78 80  Wt. (Lbs) 248 247 255.5 257.75 252 254 -  BMI 37.71 37.56 38.85 39.19 38.32 38.63 -       Hyperlipidemia LDL goal <100 Hyperlipidemia:Low fat diet discussed and encouraged.   Lipid Panel  Lab Results  Component Value Date   CHOL 181 05/16/2017   HDL 33 (L) 05/16/2017   LDLCALC NOT CALC 06/23/2015   LDLCALC NOT CALC 06/23/2015   LDLDIRECT 66 06/23/2015   TRIG 304 (H) 05/16/2017   CHOLHDL 5.5 (H) 05/16/2017  High cV risk, needs a statin, attempts, multiple to  contact pt after the visit to discuss results so far are unsuccessfully. Letter to be mailed and labs are forwarded to endo.     Type 2 diabetes with nephropathy Deteriorated and uncontrolled. Managed by endo,will forward labs Tammy Hurley is reminded of the importance of commitment to daily physical activity for 30 minutes or more, as able and the need to limit carbohydrate intake to 30 to 60 grams per meal to help with blood sugar control.   The need to take medication as prescribed, test blood sugar as directed, and to call between visits if there is a concern that blood sugar is uncontrolled is also discussed.   Tammy Hurley is reminded of the importance of daily foot exam, annual eye examination, and good blood sugar, blood pressure and cholesterol control.  Diabetic Labs Latest Ref Rng & Units 05/16/2017 10/24/2016 09/14/2015 06/23/2015 06/23/2015  HbA1c <5.7 % of total Hgb 8.9(H) - - - 7.5(H)  Microalbumin Not estab mg/dL - 1.7 5.5 - -  Micro/Creat Ratio <30 mcg/mg creat - 13 22 - -  Chol <200 mg/dL 181 - - 204(H) 220(H)  HDL >50 mg/dL 33(L) - - 44(L) 44(L)  Calc LDL <130 mg/dL - - - NOT CALC NOT CALC  Triglycerides <150 mg/dL 304(H) - - 447(H) 448(H)  Creatinine 0.60 - 0.93 mg/dL 1.73(H) - - - 1.45(H)   BP/Weight 05/16/2017 03/15/2017 01/30/2017 10/24/2016 04/09/2016 09/14/2015 0/24/0973  Systolic BP 532 992 426 834 196 222 979  Diastolic BP 84 86 82 76 73 78 80  Wt. (Lbs) 248 247 255.5 257.75 252 254 -  BMI 37.71 37.56 38.85 39.19 38.32 38.63 -   Foot/eye exam completion dates Latest Ref Rng & Units 10/24/2016 09/14/2015  Eye Exam No Retinopathy - -  Foot exam Order - - -  Foot Form Completion - Done Done

## 2017-05-19 NOTE — Assessment & Plan Note (Signed)
Hyperlipidemia:Low fat diet discussed and encouraged.   Lipid Panel  Lab Results  Component Value Date   CHOL 181 05/16/2017   HDL 33 (L) 05/16/2017   LDLCALC NOT CALC 06/23/2015   LDLCALC NOT CALC 06/23/2015   LDLDIRECT 66 06/23/2015   TRIG 304 (H) 05/16/2017   CHOLHDL 5.5 (H) 05/16/2017  High cV risk, needs a statin, attempts, multiple to contact pt after the visit to discuss results so far are unsuccessfully. Letter to be mailed and labs are forwarded to endo.

## 2017-05-19 NOTE — Assessment & Plan Note (Signed)
Uncontrolled add amlodipine 5 mg daily DASH diet and commitment to daily physical activity for a minimum of 30 minutes discussed and encouraged, as a part of hypertension management. The importance of attaining a healthy weight is also discussed.  BP/Weight 05/16/2017 03/15/2017 01/30/2017 10/24/2016 04/09/2016 09/14/2015 05/05/7354  Systolic BP 701 410 301 314 388 875 797  Diastolic BP 84 86 82 76 73 78 80  Wt. (Lbs) 248 247 255.5 257.75 252 254 -  BMI 37.71 37.56 38.85 39.19 38.32 38.63 -

## 2017-05-19 NOTE — Assessment & Plan Note (Signed)
Deteriorated and uncontrolled. Managed by endo,will forward labs Tammy Hurley is reminded of the importance of commitment to daily physical activity for 30 minutes or more, as able and the need to limit carbohydrate intake to 30 to 60 grams per meal to help with blood sugar control.   The need to take medication as prescribed, test blood sugar as directed, and to call between visits if there is a concern that blood sugar is uncontrolled is also discussed.   Tammy Hurley is reminded of the importance of daily foot exam, annual eye examination, and good blood sugar, blood pressure and cholesterol control.  Diabetic Labs Latest Ref Rng & Units 05/16/2017 10/24/2016 09/14/2015 06/23/2015 06/23/2015  HbA1c <5.7 % of total Hgb 8.9(H) - - - 7.5(H)  Microalbumin Not estab mg/dL - 1.7 5.5 - -  Micro/Creat Ratio <30 mcg/mg creat - 13 22 - -  Chol <200 mg/dL 181 - - 204(H) 220(H)  HDL >50 mg/dL 33(L) - - 44(L) 44(L)  Calc LDL <130 mg/dL - - - NOT CALC NOT CALC  Triglycerides <150 mg/dL 304(H) - - 447(H) 448(H)  Creatinine 0.60 - 0.93 mg/dL 1.73(H) - - - 1.45(H)   BP/Weight 05/16/2017 03/15/2017 01/30/2017 10/24/2016 04/09/2016 09/14/2015 5/45/6256  Systolic BP 389 373 428 768 115 726 203  Diastolic BP 84 86 82 76 73 78 80  Wt. (Lbs) 248 247 255.5 257.75 252 254 -  BMI 37.71 37.56 38.85 39.19 38.32 38.63 -   Foot/eye exam completion dates Latest Ref Rng & Units 10/24/2016 09/14/2015  Eye Exam No Retinopathy - -  Foot exam Order - - -  Foot Form Completion - Done Done

## 2017-05-31 ENCOUNTER — Ambulatory Visit: Payer: Self-pay | Admitting: Family Medicine

## 2017-06-20 ENCOUNTER — Ambulatory Visit: Payer: Medicare Other | Admitting: Family Medicine

## 2017-06-20 ENCOUNTER — Encounter: Payer: Self-pay | Admitting: Family Medicine

## 2017-06-20 VITALS — BP 140/82 | HR 83 | Resp 16 | Ht 68.0 in | Wt 245.0 lb

## 2017-06-20 DIAGNOSIS — I129 Hypertensive chronic kidney disease with stage 1 through stage 4 chronic kidney disease, or unspecified chronic kidney disease: Secondary | ICD-10-CM

## 2017-06-20 DIAGNOSIS — I10 Essential (primary) hypertension: Secondary | ICD-10-CM | POA: Diagnosis not present

## 2017-06-20 DIAGNOSIS — E1121 Type 2 diabetes mellitus with diabetic nephropathy: Secondary | ICD-10-CM | POA: Diagnosis not present

## 2017-06-20 DIAGNOSIS — N183 Chronic kidney disease, stage 3 unspecified: Secondary | ICD-10-CM

## 2017-06-20 DIAGNOSIS — E785 Hyperlipidemia, unspecified: Secondary | ICD-10-CM | POA: Diagnosis not present

## 2017-06-20 DIAGNOSIS — T733XXA Exhaustion due to excessive exertion, initial encounter: Secondary | ICD-10-CM

## 2017-06-20 DIAGNOSIS — E1169 Type 2 diabetes mellitus with other specified complication: Secondary | ICD-10-CM | POA: Diagnosis not present

## 2017-06-20 DIAGNOSIS — E1122 Type 2 diabetes mellitus with diabetic chronic kidney disease: Secondary | ICD-10-CM | POA: Diagnosis not present

## 2017-06-20 NOTE — Patient Instructions (Signed)
F/u in 4.5 months  You are being referred to cardiology and nephrology in your community  Continue your current meds  I will leave a message re colchicine on tele number [phone].  STOP allopurinol please  No pain med except tylenol please  Please get bone density test  Thank you  for choosing  [facility]. We consider it a privelige to serve you.  Delivering excellent health care in a caring and  compassionate way is our goal.  Partnering with you,  so that together we can achieve this goal is our strategy.    Fasting lipid, cmp and EGFr in 4.5 months  Please stop cheese and  Breakfast foods cut back for better blood vessels and reduced risk of heart disease and stroke

## 2017-07-02 ENCOUNTER — Encounter: Payer: Self-pay | Admitting: Family Medicine

## 2017-07-02 DIAGNOSIS — E785 Hyperlipidemia, unspecified: Secondary | ICD-10-CM

## 2017-07-02 DIAGNOSIS — N183 Chronic kidney disease, stage 3 unspecified: Secondary | ICD-10-CM | POA: Insufficient documentation

## 2017-07-02 DIAGNOSIS — I129 Hypertensive chronic kidney disease with stage 1 through stage 4 chronic kidney disease, or unspecified chronic kidney disease: Principal | ICD-10-CM

## 2017-07-02 DIAGNOSIS — T733XXA Exhaustion due to excessive exertion, initial encounter: Secondary | ICD-10-CM | POA: Insufficient documentation

## 2017-07-02 DIAGNOSIS — E1122 Type 2 diabetes mellitus with diabetic chronic kidney disease: Secondary | ICD-10-CM | POA: Insufficient documentation

## 2017-07-02 DIAGNOSIS — E1169 Type 2 diabetes mellitus with other specified complication: Secondary | ICD-10-CM | POA: Insufficient documentation

## 2017-07-02 NOTE — Assessment & Plan Note (Addendum)
Not a t gola, needs to establish with cardiologist in her local town. Has h/o palpitations, and has not had an echocardiogram for several years. Does report increased fatigue and reduced exercise tolerance. She is at increased CV risk as an uncontrolled Insulin dependent diabetic also wirh hyperlipidemia, she has not been on a statin in the past , has resisted  this but now agrees to start taking a statin, her most recent bloodwork places her at increased CV risk

## 2017-07-02 NOTE — Progress Notes (Signed)
Tammy Hurley     MRN: [id number]      DOB: [birth year]   HPI Tammy Hurley is here for follow up and re-evaluation of chronic medical conditions, medication management and review of any available recent lab and radiology data.  Preventive health is updated, specifically  Cancer screening and Immunization.  Still  Refuses immunization and needs dexa Still taking allopurinol despite being advised notnecessary C/o intermittent fatigue and poor exercise tolerance noted in pastr several months , also her blood sugar is uncontrolled but she  is working to improve  This with her endo. No current foot pain and has question as to whether she needs colchicine, of note the uric acid level is normal   ROS Denies recent fever or chills. Denies sinus pressure, nasal congestion, ear pain or sore throat. Denies chest congestion, productive cough or wheezing. Denies chest pains or  palpitations and leg swelling Denies abdominal pain, nausea, vomiting,diarrhea or constipation.   Denies dysuria, frequency, hesitancy or incontinence. C/o  joint pain, and limitation in mobility. Denies headaches, seizures, numbness, or tingling. Denies depression, anxiety or insomnia. Denies skin break down or rash.   PE  BP 140/82   Pulse 83   Resp 16   Ht 5\' 8"  (1.727 m)   Wt 245 lb (111.1 kg)   SpO2 96%   BMI 37.25 kg/m   Patient alert and oriented and in no cardiopulmonary distress.  HEENT: No facial asymmetry, EOMI,   oropharynx pink and moist.  Neck supple no JVD, no mass.  Chest: Clear to auscultation bilaterally.  CVS: S1, S2 no murmurs, no S3.Regular rate.  ABD: Soft non tender.   Ext: Trace  edema  MS: Decreased  ROM spine, shoulders, hips and knees.  Skin: Intact, no ulcerations or rash noted.  Psych: Good eye contact, normal affect. Memory intact not anxious or depressed appearing.  CNS: CN 2-12 intact, power,  normal throughout.no focal deficits noted.   Assessment & Plan  Type 2 DM  with CKD stage 3 and hypertension ([place]) Patient needs to establish with nephrology , she is educated re the need to avoid nephrotoxic agents , and to improve blood sugar and blood pressure control. Referral entered to nephrologist I her area  Essential hypertension Not a t gola, needs to establish with cardiologist in her local town. Has h/o palpitations, and has not had an echocardiogram for several years. Does report increased fatigue and reduced exercise tolerance. She is at increased CV risk as an uncontrolled Insulin dependent diabetic also wirh hyperlipidemia, she has not been on a statin in the past , has resisted  this but now agrees to start taking a statin, her most recent bloodwork places her at increased CV risk  Hyperlipidemia LDL goal <100 Uncontrolled and pt at increased risk of CV disease based on most recent labs Hyperlipidemia:Low fat diet discussed and encouraged.   Lipid Panel  Lab Results  Component Value Date   CHOL 181 05/16/2017   HDL 33 (L) 05/16/2017   LDLCALC NOT CALC 06/23/2015   LDLCALC NOT CALC 06/23/2015   LDLDIRECT 66 06/23/2015   TRIG 304 (H) 05/16/2017   CHOLHDL 5.5 (H) 05/16/2017  She is to start statin therapy , and also fish oil for TG Cardiology referral     Fatigue due to excessive exertion Reports decreased exercise tolerance and exertional fatigue intermittently in past several months will refer for cardiology evaluation she e needs this and agreees  Type 2 diabetes with nephropathy Uncontroled,  managed by endo, needs to comply with diet and treatment plan Tammy Hurley is reminded of the importance of commitment to daily physical activity for 30 minutes or more, as able and the need to limit carbohydrate intake to 30 to 60 grams per meal to help with blood sugar control.   The need to take medication as prescribed, test blood sugar as directed, and to call between visits if there is a concern that blood sugar is uncontrolled is also  discussed.   Tammy Hurley is reminded of the importance of daily foot exam, annual eye examination, and good blood sugar, blood pressure and cholesterol control.  Diabetic Labs Latest Ref Rng & Units 05/16/2017 10/24/2016 09/14/2015 06/23/2015 06/23/2015  HbA1c <5.7 % of total Hgb 8.9(H) - - - 7.5(H)  Microalbumin Not estab mg/dL - 1.7 5.5 - -  Micro/Creat Ratio <30 mcg/mg creat - 13 22 - -  Chol <200 mg/dL 181 - - 204(H) 220(H)  HDL >50 mg/dL 33(L) - - 44(L) 44(L)  Calc LDL <130 mg/dL - - - NOT CALC NOT CALC  Triglycerides <150 mg/dL 304(H) - - 447(H) 448(H)  Creatinine 0.60 - 0.93 mg/dL 1.73(H) - - - 1.45(H)   BP/Weight 06/20/2017 05/16/2017 03/15/2017 01/30/2017 10/24/2016 04/09/2016 9/52/8413  Systolic BP 244 010 272 536 644 034 742  Diastolic BP 82 84 86 82 76 73 78  Wt. (Lbs) 245 248 247 255.5 257.75 252 254  BMI 37.25 37.71 37.56 38.85 39.19 38.32 38.63   Foot/eye exam completion dates Latest Ref Rng & Units 10/24/2016 09/14/2015  Eye Exam No Retinopathy - -  Foot exam Order - - -  Foot Form Completion - Done Done

## 2017-07-02 NOTE — Assessment & Plan Note (Signed)
Patient needs to establish with nephrology , she is educated re the need to avoid nephrotoxic agents , and to improve blood sugar and blood pressure control. Referral entered to nephrologist I her area

## 2017-07-02 NOTE — Assessment & Plan Note (Signed)
Uncontrolled and pt at increased risk of CV disease based on most recent labs Hyperlipidemia:Low fat diet discussed and encouraged.   Lipid Panel  Lab Results  Component Value Date   CHOL 181 05/16/2017   HDL 33 (L) 05/16/2017   LDLCALC NOT CALC 06/23/2015   LDLCALC NOT CALC 06/23/2015   LDLDIRECT 66 06/23/2015   TRIG 304 (H) 05/16/2017   CHOLHDL 5.5 (H) 05/16/2017  She is to start statin therapy , and also fish oil for TG Cardiology referral

## 2017-07-02 NOTE — Assessment & Plan Note (Signed)
Reports decreased exercise tolerance and exertional fatigue intermittently in past several months will refer for cardiology evaluation she e needs this and agreees

## 2017-07-02 NOTE — Assessment & Plan Note (Signed)
Uncontroled, managed by endo, needs to comply with diet and treatment plan Tammy Hurley is reminded of the importance of commitment to daily physical activity for 30 minutes or more, as able and the need to limit carbohydrate intake to 30 to 60 grams per meal to help with blood sugar control.   The need to take medication as prescribed, test blood sugar as directed, and to call between visits if there is a concern that blood sugar is uncontrolled is also discussed.   Tammy Hurley is reminded of the importance of daily foot exam, annual eye examination, and good blood sugar, blood pressure and cholesterol control.  Diabetic Labs Latest Ref Rng & Units 05/16/2017 10/24/2016 09/14/2015 06/23/2015 06/23/2015  HbA1c <5.7 % of total Hgb 8.9(H) - - - 7.5(H)  Microalbumin Not estab mg/dL - 1.7 5.5 - -  Micro/Creat Ratio <30 mcg/mg creat - 13 22 - -  Chol <200 mg/dL 181 - - 204(H) 220(H)  HDL >50 mg/dL 33(L) - - 44(L) 44(L)  Calc LDL <130 mg/dL - - - NOT CALC NOT CALC  Triglycerides <150 mg/dL 304(H) - - 447(H) 448(H)  Creatinine 0.60 - 0.93 mg/dL 1.73(H) - - - 1.45(H)   BP/Weight 06/20/2017 05/16/2017 03/15/2017 01/30/2017 10/24/2016 04/09/2016 6/97/9480  Systolic BP 165 537 482 707 867 544 920  Diastolic BP 82 84 86 82 76 73 78  Wt. (Lbs) 245 248 247 255.5 257.75 252 254  BMI 37.25 37.71 37.56 38.85 39.19 38.32 38.63   Foot/eye exam completion dates Latest Ref Rng & Units 10/24/2016 09/14/2015  Eye Exam No Retinopathy - -  Foot exam Order - - -  Foot Form Completion - Done Done

## 2017-07-03 ENCOUNTER — Telehealth: Payer: Self-pay | Admitting: Family Medicine

## 2017-07-03 NOTE — Telephone Encounter (Signed)
I called and advised pateint not to take colchicine as this is fo gout pain, states she only takes if she has pain

## 2017-08-02 ENCOUNTER — Other Ambulatory Visit: Payer: Self-pay

## 2017-08-02 MED ORDER — AMLODIPINE BESYLATE 5 MG PO TABS: 5.0000 mg | ORAL_TABLET | Freq: Every day | ORAL | 1 refills | Status: AC

## 2017-08-02 MED ORDER — TOPROL XL 200 MG PO TB24: 200.0000 mg | ORAL_TABLET | Freq: Every day | ORAL | 3 refills | Status: DC

## 2017-08-02 MED ORDER — POTASSIUM CHLORIDE CRYS ER 20 MEQ PO TBCR: 20.0000 meq | EXTENDED_RELEASE_TABLET | Freq: Two times a day (BID) | ORAL | 1 refills | Status: DC

## 2017-08-02 MED ORDER — PRAVASTATIN SODIUM 10 MG PO TABS: 10.0000 mg | ORAL_TABLET | Freq: Every day | ORAL | 3 refills | Status: AC

## 2017-08-02 MED ORDER — TRIAMTERENE-HCTZ 75-50 MG PO TABS: 1.0000 | ORAL_TABLET | Freq: Every day | ORAL | 3 refills | Status: DC

## 2017-08-04 ENCOUNTER — Other Ambulatory Visit: Payer: Self-pay

## 2017-08-04 MED ORDER — TRIAMTERENE-HCTZ 75-50 MG PO TABS: 1.0000 | ORAL_TABLET | Freq: Every day | ORAL | 3 refills | Status: DC

## 2017-08-04 MED ORDER — TOPROL XL 200 MG PO TB24: 200.0000 mg | ORAL_TABLET | Freq: Every day | ORAL | 3 refills | Status: AC

## 2017-08-17 ENCOUNTER — Telehealth: Payer: Self-pay | Admitting: Family Medicine

## 2017-08-17 NOTE — Telephone Encounter (Signed)
Cassandra with Dr.Whitackers office called to request lab order for patient before her appt on 08/24/17. Needs Renal panel and cbc with differential platelets.  Cb#: [phone]

## 2017-08-18 ENCOUNTER — Other Ambulatory Visit: Payer: Self-pay

## 2017-08-18 DIAGNOSIS — I10 Essential (primary) hypertension: Secondary | ICD-10-CM

## 2017-08-18 DIAGNOSIS — N183 Chronic kidney disease, stage 3 unspecified: Secondary | ICD-10-CM

## 2017-08-18 DIAGNOSIS — E1122 Type 2 diabetes mellitus with diabetic chronic kidney disease: Secondary | ICD-10-CM

## 2017-08-18 DIAGNOSIS — I129 Hypertensive chronic kidney disease with stage 1 through stage 4 chronic kidney disease, or unspecified chronic kidney disease: Secondary | ICD-10-CM

## 2017-08-18 NOTE — Telephone Encounter (Signed)
pls order cbc chem 7 and EGFr, dx is hypertension and IDDM with chronic kidney disease

## 2017-08-18 NOTE — Telephone Encounter (Signed)
Called Dr. Gertie Exon office w/ no answer to let them know lab orders are in for patient. Did not leave message. Called patient with no answer.left message requesting call back.

## 2017-08-18 NOTE — Telephone Encounter (Signed)
Would you like for me to order these labs for this patient? Please advise. Thank you.

## 2017-08-24 ENCOUNTER — Telehealth: Payer: Self-pay | Admitting: Family Medicine

## 2017-08-24 NOTE — Telephone Encounter (Signed)
I Returned a  call to [employer] at Dr. Gertie Exon office, they are requesting most recent labs for patient. The last completed labs we have are from January. She stated they can not reach patient to let her know she needs to get her labs done before her appt.   I called the patient to let her know she had active labs for this and she told me "" To tell Dr.Simpson she was not interested, she was not going""  and she hung up on me mid conversation.   **patient no showed her appt to Dr.Whitaker (nephrologist) today. [place] [facility] [place]

## 2017-09-01 NOTE — Telephone Encounter (Signed)
Pt no showed and we had no recent labs on her

## 2017-11-07 ENCOUNTER — Ambulatory Visit: Payer: Self-pay | Admitting: Family Medicine

## 2017-11-14 ENCOUNTER — Encounter: Payer: Self-pay | Admitting: Family Medicine

## 2018-01-31 ENCOUNTER — Ambulatory Visit (INDEPENDENT_AMBULATORY_CARE_PROVIDER_SITE_OTHER): Payer: Medicare Other | Admitting: Specialist

## 2018-01-31 ENCOUNTER — Ambulatory Visit (INDEPENDENT_AMBULATORY_CARE_PROVIDER_SITE_OTHER): Payer: Medicare Other

## 2018-01-31 ENCOUNTER — Encounter (INDEPENDENT_AMBULATORY_CARE_PROVIDER_SITE_OTHER): Payer: Self-pay | Admitting: Specialist

## 2018-01-31 VITALS — BP 172/73 | HR 52 | Ht 68.0 in | Wt 245.0 lb

## 2018-01-31 DIAGNOSIS — E1122 Type 2 diabetes mellitus with diabetic chronic kidney disease: Secondary | ICD-10-CM

## 2018-01-31 DIAGNOSIS — G8929 Other chronic pain: Secondary | ICD-10-CM

## 2018-01-31 DIAGNOSIS — M4316 Spondylolisthesis, lumbar region: Secondary | ICD-10-CM | POA: Diagnosis not present

## 2018-01-31 DIAGNOSIS — M5442 Lumbago with sciatica, left side: Secondary | ICD-10-CM | POA: Diagnosis not present

## 2018-01-31 DIAGNOSIS — M48062 Spinal stenosis, lumbar region with neurogenic claudication: Secondary | ICD-10-CM

## 2018-01-31 DIAGNOSIS — N183 Chronic kidney disease, stage 3 unspecified: Secondary | ICD-10-CM

## 2018-01-31 DIAGNOSIS — M1711 Unilateral primary osteoarthritis, right knee: Secondary | ICD-10-CM

## 2018-01-31 DIAGNOSIS — M4807 Spinal stenosis, lumbosacral region: Secondary | ICD-10-CM

## 2018-01-31 DIAGNOSIS — I129 Hypertensive chronic kidney disease with stage 1 through stage 4 chronic kidney disease, or unspecified chronic kidney disease: Secondary | ICD-10-CM

## 2018-01-31 MED ORDER — TRAMADOL HCL 50 MG PO TABS: 50.0000 mg | ORAL_TABLET | Freq: Four times a day (QID) | ORAL | 0 refills | Status: AC | PRN

## 2018-01-31 MED ORDER — GABAPENTIN 100 MG PO CAPS: ORAL_CAPSULE | ORAL | 0 refills | Status: AC

## 2018-01-31 NOTE — Progress Notes (Signed)
Office Visit Note   Patient: Tammy Hurley           Date of Birth: 1946-04-09           MRN: [id number] Visit Date: 01/31/2018              Requested by: Fayrene Helper, [address], [place], [place] [address] PCP: Fayrene Helper, MD   Assessment & Plan: Visit Diagnoses:  1. Chronic left-sided low back pain with left-sided sciatica   2. Unilateral primary osteoarthritis, right knee   3. Spinal stenosis of lumbar region with neurogenic claudication   4. Spondylolisthesis of lumbar region   5. Type 2 DM with CKD stage 3 and hypertension (H[facility])     Plan:Avoid bending, stooping and avoid lifting weights greater than 10 lbs. Avoid prolong standing and walking. Avoid frequent bending and stooping  No lifting greater than 10 lbs. May use ice or moist heat for pain. Weight loss is of benefit. Handicap license is approved. Restart allopurinol. No NSAIDs, ie motrin, alleve and any other anti arthritis med. Tylenol in small amounts is okay. Capsaicin oint or creame applied locally CBD oil oral or cream may be of benefit.  Use a stationary bike or pool walking.  Follow-Up Instructions: Return in about 8 weeks (around 03/28/2018).   Orders:  Orders Placed This Encounter  Procedures  . XR Lumbar Spine 2-3 Views   No orders of the defined types were placed in this encounter.     Procedures: No procedures performed   Clinical Data: No additional findings.   Subjective: Chief Complaint  Patient presents with  . Lower Back - Pain    72 year old female with history of low back and left leg pain for 20 year. The leg pain is constant especially with driving and at night. She has pain in the hips, right groin and down the legs. Sometimes getting up the right leg wants to give away. She use a walking stick, mutlipurpose, an old mop handle. It limits her standing and walking and ability to walk up and down steps. Does one at a time leading with the left  one. The left knee with bone on bone. Pain in the left knee about 2 months ago with improvement, saw an Human resources officer, Dr. Baldomero Lamy. With [place]. No bowel or bladder difficulty. The pain meds make the bowel not want to move. She takes a 7.5 mg oxycodone with tylenol one every 2-3 weeks and tylenol 1-2 times per day. 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 She has had some heart difficulties.Pain and heart pain got worse, [facility] Fuller Song at [facility] is her cardiologist. It hurts to wash dishes and stand up. In the past went to the [facility] for exercise. She had stopped for almost  One or two years. Uses a golf cart at work. She leans on the carts at the grocery store. Sitting is better than walking but even sitting is becoming uncomfortable. She probably can walk a block with leaning over. She has to lean and stoop. Numbness down the front of the legs and also in the arms and hands. At night Awakens with hand numbness, bilateral, and when sitting. Numbness with using the cell phone and into the little finger. Writing is getting worse, in the past was pretty. SOmetimes with increased difficuty buttoning buttons. She lays on her side can't lie on her back. No diagnosis of sleep apnea.  Review of Systems  Constitutional: Positive for activity change and unexpected weight change. Negative for appetite change, chills, diaphoresis, fatigue and fever.  HENT: Positive for hearing loss. Negative for congestion, dental problem, drooling, ear discharge, ear pain, facial swelling, mouth sores, nosebleeds, postnasal drip, rhinorrhea, sinus pressure, sinus pain, sneezing, sore throat, tinnitus and trouble swallowing.   Eyes: Negative.  Negative for photophobia, pain, discharge, redness, itching and visual disturbance.  Respiratory: Negative.  Negative for apnea, cough, choking, chest tightness, shortness of breath, wheezing and stridor.   Cardiovascular: Positive for chest pain. Negative for  palpitations and leg swelling.  Gastrointestinal: Negative.  Negative for abdominal distention, abdominal pain, anal bleeding, blood in stool, constipation, diarrhea, nausea and rectal pain.  Endocrine: Negative.  Negative for heat intolerance, polydipsia, polyphagia and polyuria.  Genitourinary: Negative.  Negative for difficulty urinating, dyspareunia, dysuria, enuresis, flank pain, frequency, genital sores and hematuria.  Musculoskeletal: Positive for myalgias and neck stiffness. Negative for arthralgias, back pain, gait problem, joint swelling and neck pain.  Skin: Negative.  Negative for color change, pallor, rash and wound.  Allergic/Immunologic: Negative.  Negative for environmental allergies and food allergies.  Neurological: Positive for dizziness, seizures, syncope, weakness, light-headedness and numbness. Negative for tremors, facial asymmetry, speech difficulty and headaches.  Hematological: Negative for adenopathy. Does not bruise/bleed easily.  Psychiatric/Behavioral: Negative.  Negative for agitation, behavioral problems, confusion, decreased concentration, dysphoric mood, hallucinations, self-injury, sleep disturbance and suicidal ideas. The patient is not nervous/anxious and is not hyperactive.      Objective: Vital Signs: BP (!) 172/73 (BP Location: Left Arm, Patient Position: Sitting)   Pulse (!) 52   Ht 5\' 8"  (1.727 m)   Wt 245 lb (111.1 kg)   BMI 37.25 kg/m   Physical Exam  Constitutional: She is oriented to person, place, and time. She appears well-developed and well-nourished. No distress.  HENT:  Head: Normocephalic and atraumatic.  Eyes: Pupils are equal, round, and reactive to light. EOM are normal. Right eye exhibits no discharge. Left eye exhibits no discharge. No scleral icterus.  Neck: Normal range of motion.  Pulmonary/Chest: Effort normal and breath sounds normal.  Abdominal: Soft. Bowel sounds are normal.  Musculoskeletal: She exhibits no edema,  tenderness or deformity.  Neurological: She is alert and oriented to person, place, and time.  Skin: Skin is warm and dry. She is not diaphoretic.  Psychiatric: She has a normal mood and affect. Her behavior is normal. Judgment and thought content normal.    Ortho Exam  Specialty Comments:  No specialty comments available.  Imaging: Xr Lumbar Spine 2-3 Views  Result Date: 01/31/2018 [facility] and lateral flexion and extension radiographs of the lumbar spine shown mild DDD L3-4, L4-5 and L5-S1 with grade one anterolisthesis L3-4 and L4-5 mild degenerative disc. Scoliosis Of the lumbar spine left 11 degrees T10-L4    PMFS History: Patient Active Problem List   Diagnosis Date Noted  . Type 2 DM with CKD stage 3 and hypertension ([place]) 07/02/2017  . Hyperlipidemia associated with type 2 diabetes mellitus ([place]) 07/02/2017  . Fatigue due to excessive exertion 07/02/2017  . Foot pain, bilateral 05/16/2017  . Allergic dermatitis 03/19/2017  . Dermatitis 03/15/2017  . Non compliance with medical treatment 09/21/2015  . Immunization refused 09/21/2015  . Hearing loss 01/22/2015  . Hyperlipidemia LDL goal <100 07/15/2012  . Sleep apnea, obstructive 05/02/2012  . Type 2 diabetes with nephropathy ([place]) 08/08/2011  . RBBB (right bundle branch block) 12/02/2010  . Back pain with sciatica  07/27/2009  . PAROXYSMAL SUPRAVENTRICULAR TACHYCARDIA 03/17/2009  . GERD 02/03/2008  . Osteoporosis 01/29/2008  . Obesity, Class II, BMI 35.0-39.9, with comorbidity (see actual BMI) 11/30/2007  . Essential hypertension 11/30/2007  . OSTEOARTHRITIS 11/30/2007   Past Medical History:  Diagnosis Date  . Back pain   . Diabetes mellitus, type 2 ([place])   . Gastroesophageal reflux disease   . Hypercholesteremia   . Hypertension   . Obesity   . Osteoarthritis   . Palpitations   . Supraventricular tachycardia ([facility])     Family History  Problem Relation Age of Onset  . Cancer Mother        Colon  . Alzheimer's  disease Father   . Diabetes Brother        x3  . Hyperlipidemia Sister   . Diabetes Brother   . Diabetes Brother   . Colon cancer Neg Hx     Past Surgical History:  Procedure Laterality Date  . ABDOMINAL HYSTERECTOMY    . APPENDECTOMY    . CHOLECYSTECTOMY    . COLONOSCOPY N/A 08/30/2012   Procedure: COLONOSCOPY;  Surgeon: Rogene Houston, MD;  Location: [facility];  Service: Endoscopy;  Laterality: N/A;  830-rescheduled to [place] notified pt  . ENUCLEATION  02/07   right   . LEFT OOPHORECTOMY    . refiting of r prosthesis  03/09  . RETINAL DETACHMENT REPAIR W/ SCLERAL BUCKLE LE    . right eye prostheslis  03/07  . TUBAL LIGATION    . VESICOVAGINAL FISTULA CLOSURE W/ TAH     Social History   Occupational History  . Occupation: unemployed  Tobacco Use  . Smoking status: Former Smoker    Packs/day: 0.30    Years: 3.00    Pack years: 0.90    Types: Cigarettes    Last attempt to quit: 04/25/1980    Years since quitting: 37.7  . Smokeless tobacco: Never Used  Substance and Sexual Activity  . Alcohol use: No    Comment: Ocassionally  . Drug use: No  . Sexual activity: Not on file

## 2018-01-31 NOTE — Patient Instructions (Addendum)
Avoid bending, stooping and avoid lifting weights greater than 10 lbs. Avoid prolong standing and walking. Avoid frequent bending and stooping  No lifting greater than 10 lbs. May use ice or moist heat for pain. Weight loss is of benefit. Handicap license is approved. Restart allopurinol. No NSAIDs, ie motrin, alleve and any other anti arthritis med. Tylenol in small amounts is okay. Capsaicin oint or creame applied locally CBD oil oral or cream may be of benefit.  Knee is suffering from osteoarthritis, only real proven treatments are Weight loss,  and exercise. Well padded shoes help. Ice the knee 2-3 times a day 15-20 mins at a time.

## 2018-02-19 ENCOUNTER — Other Ambulatory Visit: Payer: Medicare Other

## 2018-03-01 ENCOUNTER — Ambulatory Visit (INDEPENDENT_AMBULATORY_CARE_PROVIDER_SITE_OTHER): Payer: Medicare Other | Admitting: Specialist

## 2018-05-05 ENCOUNTER — Other Ambulatory Visit: Payer: Self-pay | Admitting: Family Medicine

## 2018-05-07 ENCOUNTER — Other Ambulatory Visit: Payer: Self-pay | Admitting: Family Medicine

## 2018-05-20 ENCOUNTER — Other Ambulatory Visit: Payer: Self-pay | Admitting: Family Medicine

## 2018-08-19 ENCOUNTER — Other Ambulatory Visit: Payer: Self-pay | Admitting: Family Medicine

## 2019-03-26 ENCOUNTER — Other Ambulatory Visit: Payer: Self-pay | Admitting: Family Medicine

## 2019-05-02 ENCOUNTER — Other Ambulatory Visit: Payer: Self-pay | Admitting: Family Medicine

## 2019-05-04 ENCOUNTER — Other Ambulatory Visit: Payer: Self-pay | Admitting: Family Medicine

## 2019-05-29 ENCOUNTER — Other Ambulatory Visit: Payer: Self-pay | Admitting: Family Medicine

## 2019-07-06 ENCOUNTER — Other Ambulatory Visit: Payer: Self-pay | Admitting: Family Medicine

## 2019-08-14 ENCOUNTER — Other Ambulatory Visit: Payer: Self-pay | Admitting: Family Medicine

## 2020-06-25 ENCOUNTER — Other Ambulatory Visit: Payer: Self-pay | Admitting: Family Medicine

## 2022-08-18 ENCOUNTER — Encounter (INDEPENDENT_AMBULATORY_CARE_PROVIDER_SITE_OTHER): Payer: Self-pay | Admitting: *Deleted
